# Patient Record
Sex: Male | Born: 1955 | Race: White | Hispanic: No | Marital: Single | Smoking: Never smoker
Health system: Southern US, Community
[De-identification: ages and names within clinical notes are randomized; demographics above are authoritative.]

## PROBLEM LIST (undated history)

## (undated) ENCOUNTER — Ambulatory Visit (INDEPENDENT_AMBULATORY_CARE_PROVIDER_SITE_OTHER): Admission: RE | Payer: Self-pay

## (undated) DIAGNOSIS — Z87898 Personal history of other specified conditions: Secondary | ICD-10-CM

## (undated) DIAGNOSIS — M8700 Idiopathic aseptic necrosis of unspecified bone: Secondary | ICD-10-CM

## (undated) DIAGNOSIS — H539 Unspecified visual disturbance: Secondary | ICD-10-CM

## (undated) DIAGNOSIS — E785 Hyperlipidemia, unspecified: Secondary | ICD-10-CM

## (undated) DIAGNOSIS — M25559 Pain in unspecified hip: Secondary | ICD-10-CM

## (undated) DIAGNOSIS — G8929 Other chronic pain: Secondary | ICD-10-CM

## (undated) DIAGNOSIS — I10 Essential (primary) hypertension: Secondary | ICD-10-CM

## (undated) DIAGNOSIS — R262 Difficulty in walking, not elsewhere classified: Secondary | ICD-10-CM

## (undated) DIAGNOSIS — M1990 Unspecified osteoarthritis, unspecified site: Secondary | ICD-10-CM

## (undated) DIAGNOSIS — K219 Gastro-esophageal reflux disease without esophagitis: Secondary | ICD-10-CM

## (undated) DIAGNOSIS — D869 Sarcoidosis, unspecified: Secondary | ICD-10-CM

## (undated) HISTORY — DX: Essential (primary) hypertension: I10

## (undated) HISTORY — DX: Pain in unspecified hip: M25.559

## (undated) HISTORY — DX: Personal history of other specified conditions: Z87.898

## (undated) HISTORY — DX: Sarcoidosis, unspecified: D86.9

## (undated) HISTORY — DX: Hyperlipidemia, unspecified: E78.5

## (undated) HISTORY — PX: ROTATOR CUFF REPAIR: SHX139

## (undated) HISTORY — DX: Idiopathic aseptic necrosis of unspecified bone: M87.00

## (undated) HISTORY — PX: JOINT REPLACEMENT: SHX530

## (undated) HISTORY — DX: Gastro-esophageal reflux disease without esophagitis: K21.9

## (undated) HISTORY — DX: Other chronic pain: G89.29

---

## 1999-03-08 ENCOUNTER — Ambulatory Visit
Admit: 1999-03-08 | Disposition: A | Discharge status: Home / Self Care | Payer: Self-pay | Source: Ambulatory Visit | Admitting: Orthopaedic Surgery

## 1999-03-28 ENCOUNTER — Ambulatory Visit
Admit: 1999-03-28 | Disposition: A | Discharge status: Home / Self Care | Payer: Self-pay | Source: Ambulatory Visit | Admitting: Neurology

## 1999-04-05 ENCOUNTER — Ambulatory Visit
Admit: 1999-04-05 | Disposition: A | Discharge status: Home / Self Care | Payer: Self-pay | Source: Ambulatory Visit | Admitting: Neurology

## 1999-04-08 ENCOUNTER — Ambulatory Visit: Admission: EM | Admit: 1999-04-08 | Payer: Self-pay | Source: Emergency Department

## 1999-04-11 ENCOUNTER — Ambulatory Visit
Admit: 1999-04-11 | Disposition: A | Discharge status: Home / Self Care | Payer: Self-pay | Source: Ambulatory Visit | Admitting: Infectious Disease

## 1999-05-07 ENCOUNTER — Ambulatory Visit: Admit: 1999-05-07 | Disposition: A | Discharge status: Home / Self Care | Payer: Self-pay | Source: Ambulatory Visit

## 1999-11-27 ENCOUNTER — Ambulatory Visit: Admit: 1999-11-27 | Disposition: A | Discharge status: Home / Self Care | Payer: Self-pay | Source: Ambulatory Visit

## 1999-12-07 ENCOUNTER — Ambulatory Visit: Admit: 1999-12-07 | Disposition: A | Discharge status: Home / Self Care | Payer: Self-pay | Source: Ambulatory Visit

## 1999-12-12 ENCOUNTER — Ambulatory Visit: Admit: 1999-12-12 | Disposition: A | Discharge status: Home / Self Care | Payer: Self-pay | Source: Ambulatory Visit

## 1999-12-21 ENCOUNTER — Emergency Department: Admit: 1999-12-21 | Payer: Self-pay | Source: Emergency Department | Admitting: Emergency Medical Services

## 2000-01-14 ENCOUNTER — Ambulatory Visit: Admit: 2000-01-14 | Disposition: A | Discharge status: Home / Self Care | Payer: Self-pay | Source: Ambulatory Visit

## 2000-02-12 ENCOUNTER — Ambulatory Visit
Admit: 2000-02-12 | Disposition: A | Discharge status: Home / Self Care | Payer: Self-pay | Source: Ambulatory Visit | Admitting: Critical Care Medicine

## 2000-07-12 ENCOUNTER — Emergency Department: Admit: 2000-07-12 | Payer: Self-pay | Source: Emergency Department

## 2000-07-15 ENCOUNTER — Ambulatory Visit: Admission: RE | Admit: 2000-07-15 | Payer: Self-pay | Source: Ambulatory Visit | Admitting: Otolaryngology

## 2002-04-08 DIAGNOSIS — G971 Other reaction to spinal and lumbar puncture: Secondary | ICD-10-CM

## 2002-04-08 HISTORY — DX: Other reaction to spinal and lumbar puncture: G97.1

## 2002-05-06 ENCOUNTER — Ambulatory Visit: Admit: 2002-05-06 | Disposition: A | Discharge status: Home / Self Care | Payer: Self-pay | Source: Ambulatory Visit

## 2004-06-01 ENCOUNTER — Ambulatory Visit: Admit: 2004-06-01 | Disposition: A | Discharge status: Home / Self Care | Payer: Self-pay | Source: Ambulatory Visit

## 2004-08-06 ENCOUNTER — Inpatient Hospital Stay
Admission: RE | Admit: 2004-08-06 | Disposition: A | Discharge status: Home / Self Care | Payer: Self-pay | Source: Ambulatory Visit

## 2004-11-06 HISTORY — PX: TOTAL HIP ARTHROPLASTY: SHX124

## 2011-01-01 ENCOUNTER — Other Ambulatory Visit (INDEPENDENT_AMBULATORY_CARE_PROVIDER_SITE_OTHER): Payer: Self-pay | Admitting: Family Medicine

## 2011-01-10 ENCOUNTER — Ambulatory Visit
Admit: 2011-01-10 | Discharge: 2011-01-10 | Disposition: A | Discharge status: Home / Self Care | Payer: Self-pay | Source: Ambulatory Visit

## 2011-01-10 LAB — CBC
Hematocrit: 42.2 % (ref 42.0–52.0)
Hgb: 14.5 g/dL (ref 13.0–17.0)
MCH: 31.2 pg (ref 28.0–32.0)
MCHC: 34.4 g/dL (ref 32.0–36.0)
MCV: 90.8 fL (ref 80.0–100.0)
MPV: 10.5 fL (ref 9.4–12.3)
Nucleated RBC: 0 /100 WBC
Platelets: 180 10*3/uL (ref 140–400)
RBC: 4.65 10*6/uL — ABNORMAL LOW (ref 4.70–6.00)
RDW: 13 % (ref 12–15)
WBC: 4.9 10*3/uL (ref 3.50–10.80)

## 2011-01-10 LAB — GFR: EGFR: 57.7

## 2011-01-10 LAB — BASIC METABOLIC PANEL
Anion Gap: 12 (ref 5.0–15.0)
BUN: 16 mg/dL (ref 7–21)
CO2: 22 mEq/L (ref 22–31)
Calcium: 9 mg/dL (ref 8.6–10.2)
Chloride: 105 mEq/L (ref 98–107)
Creatinine: 1.3 mg/dL (ref 0.5–1.4)
Glucose: 132 mg/dL — ABNORMAL HIGH (ref 70–100)
Potassium: 3.7 mEq/L (ref 3.6–5.0)
Sodium: 139 mEq/L (ref 136–143)

## 2011-01-10 LAB — PT/INR
PT INR: 0.9 (ref 0.9–1.1)
PT: 12.6 s (ref 12.6–15.0)

## 2011-01-10 LAB — APTT: PTT: 26 s (ref 23–37)

## 2011-01-11 LAB — MRSA CULTURE

## 2011-01-18 HISTORY — PX: REPLACEMENT TOTAL KNEE: SUR1224

## 2011-01-24 ENCOUNTER — Ambulatory Visit: Payer: Self-pay

## 2011-01-24 ENCOUNTER — Inpatient Hospital Stay
Admission: RE | Admit: 2011-01-24 | Disposition: A | Discharge status: Home / Self Care | Payer: Self-pay | Source: Ambulatory Visit | Attending: Orthopaedic Surgery | Admitting: Orthopaedic Surgery

## 2011-01-24 LAB — TYPE AND SCREEN
AB Screen Gel: NEGATIVE
ABO Rh: A POS

## 2011-01-25 LAB — BASIC METABOLIC PANEL
Anion Gap: 9 (ref 5.0–15.0)
BUN: 13 mg/dL (ref 7–21)
CO2: 25 mEq/L (ref 22–31)
Calcium: 8.3 mg/dL — ABNORMAL LOW (ref 8.6–10.2)
Chloride: 103 mEq/L (ref 98–107)
Creatinine: 1 mg/dL (ref 0.5–1.4)
Glucose: 125 mg/dL — ABNORMAL HIGH (ref 70–100)
Potassium: 4.1 mEq/L (ref 3.6–5.0)
Sodium: 137 mEq/L (ref 136–143)

## 2011-01-25 LAB — CBC AND DIFFERENTIAL
Basophils Absolute Automated: 0.01 10*3/uL (ref 0.00–0.20)
Basophils Automated: 0 % (ref 0–2)
Eosinophils Absolute Automated: 0.03 10*3/uL (ref 0.00–0.70)
Eosinophils Automated: 0 % (ref 0–5)
Hematocrit: 35.9 % — ABNORMAL LOW (ref 42.0–52.0)
Hgb: 12.2 g/dL — ABNORMAL LOW (ref 13.0–17.0)
Immature Granulocytes Absolute: 0.03 10*3/uL
Immature Granulocytes: 0 % (ref 0–1)
Lymphocytes Absolute Automated: 1.49 10*3/uL (ref 0.50–4.40)
Lymphocytes Automated: 14 % — ABNORMAL LOW (ref 15–41)
MCH: 30.7 pg (ref 28.0–32.0)
MCHC: 34 g/dL (ref 32.0–36.0)
MCV: 90.2 fL (ref 80.0–100.0)
MPV: 10.8 fL (ref 9.4–12.3)
Monocytes Absolute Automated: 0.99 10*3/uL (ref 0.00–1.20)
Monocytes: 9 % (ref 0–11)
Neutrophils Absolute: 8.06 10*3/uL (ref 1.80–8.10)
Neutrophils: 76 % — ABNORMAL HIGH (ref 52–75)
Nucleated RBC: 0 /100 WBC
Platelets: 149 10*3/uL (ref 140–400)
RBC: 3.98 10*6/uL — ABNORMAL LOW (ref 4.70–6.00)
RDW: 12 % (ref 12–15)
WBC: 10.58 10*3/uL (ref 3.50–10.80)

## 2011-01-25 LAB — GFR: EGFR: >60

## 2011-01-26 LAB — CBC AND DIFFERENTIAL
Basophils Absolute Automated: 0.02 10*3/uL (ref 0.00–0.20)
Basophils Automated: 0 % (ref 0–2)
Eosinophils Absolute Automated: 0.12 10*3/uL (ref 0.00–0.70)
Eosinophils Automated: 2 % (ref 0–5)
Hematocrit: 34.9 % — ABNORMAL LOW (ref 42.0–52.0)
Hgb: 11.8 g/dL — ABNORMAL LOW (ref 13.0–17.0)
Immature Granulocytes Absolute: 0.02 10*3/uL
Immature Granulocytes: 0 % (ref 0–1)
Lymphocytes Absolute Automated: 1.4 10*3/uL (ref 0.50–4.40)
Lymphocytes Automated: 19 % (ref 15–41)
MCH: 30.7 pg (ref 28.0–32.0)
MCHC: 33.8 g/dL (ref 32.0–36.0)
MCV: 90.9 fL (ref 80.0–100.0)
MPV: 10.5 fL (ref 9.4–12.3)
Monocytes Absolute Automated: 0.94 10*3/uL (ref 0.00–1.20)
Monocytes: 13 % — ABNORMAL HIGH (ref 0–11)
Neutrophils Absolute: 4.93 10*3/uL (ref 1.80–8.10)
Neutrophils: 66 % (ref 52–75)
Nucleated RBC: 0 /100 WBC
Platelets: 125 10*3/uL — ABNORMAL LOW (ref 140–400)
RBC: 3.84 10*6/uL — ABNORMAL LOW (ref 4.70–6.00)
RDW: 13 % (ref 12–15)
WBC: 7.41 10*3/uL (ref 3.50–10.80)

## 2011-01-26 LAB — BASIC METABOLIC PANEL
Anion Gap: 8 (ref 5.0–15.0)
BUN: 10 mg/dL (ref 7–21)
CO2: 26 mEq/L (ref 22–31)
Calcium: 8.4 mg/dL — ABNORMAL LOW (ref 8.6–10.2)
Chloride: 101 mEq/L (ref 98–107)
Creatinine: 1.1 mg/dL (ref 0.5–1.4)
Glucose: 124 mg/dL — ABNORMAL HIGH (ref 70–100)
Potassium: 3.9 mEq/L (ref 3.6–5.0)
Sodium: 135 mEq/L — ABNORMAL LOW (ref 136–143)

## 2011-01-26 LAB — GFR: EGFR: >60

## 2011-01-28 LAB — LAB USE ONLY - HISTORICAL SURGICAL PATHOLOGY

## 2011-02-06 NOTE — Op Note (Signed)
Jerry Hickman, Jerry Hickman      MRN:          [id number]      Account:      [id number]      Document ID:  [id number]      Procedure Date: 01/24/2011            Admit Date: 01/24/2011            Patient Location: [id number]      Patient Type: I            SURGEON: Ashok Croon MD      ASSISTANT:  Joanne Gavel PA                  PREOPERATIVE DIAGNOSIS:      End-stage degenerative joint disease, right knee.            POSTOPERATIVE DIAGNOSIS:      End-stage degenerative joint disease, right knee.            TITLE OF PROCEDURE:      Right total knee arthroplasty.            ANESTHESIA:      General with femoral and sciatic nerve block.            INDICATIONS FOR PROCEDURE:      The patient is a healthy active 55 year old gentleman with end-stage      arthritis involving the patellofemoral and medial compartment of his knee.      He has failed all manner of conservative management and also had a prior      arthroscopy.  We discussed surgical treatment, risks, benefits, and      potential complications were discussed for total knee arthroplasty.  The      patient agreed and wished to proceed with surgery as described.            DESCRIPTION OF PROCEDURE:      The patient was taken to the operating room, placed in a supine position on      the OR table.  General anesthesia was initiated, 1 g Ancef given      preoperatively.  The right lower extremity was prepped and draped in a      standard sterile fashion and appropriately identified.  We turned our      attention to the right knee.  Esmarch was used for exsanguination,      tourniquet inflated to 350.  We made a standard midline approach, incised      through the skin and subcutaneous layer using Bovie cautery for hemostasis.       A medial parapatellar arthrotomy was performed.  Eburnated bone was noted      in the medial patellofemoral compartments.  We excised remnants of the ACL,      PCL, and menisci.  We then drilled for the intramedullary on the femur, we       resected 11 mm at a 5 degree valgus cut from the distal femur, made sure      this cut was smooth.  We turned our attention to the tibia, resected the      appropriate tibial bone 2 mm from the more deficient medial side with a      perpendicular cut, approximately 3 degrees posterior slope.  We checked our      extension gap at 11 and this fit well.  We then turned our attention back  Page 1 of 2      Jerry Hickman, Jerry Hickman      MRN:          [id number]      Account:      [id number]      Document ID:  [id number]      Procedure Date: 01/24/2011            to the femur, sized it at 5, made the anterior, posterior, chamfer, and box      cuts, and trialed the femoral component, which fit well.  We turned our      attention to the tibia, sized this at 5, and placed a 10 spacer in marked      rotation of the tibial component.  We then sized the patella at size 38,      resected appropriate bone to leave 15 mm, and then drilled for the 3 lug      holes.  All trial components were placed, again tibial component rotation      was marked.  We completed tibial preparation by drilling and tamping for      the fins.            Following this, we irrigated thoroughly using Pulsavac irrigation and dried      thoroughly.  Cement was mixed and components were cemented into position.      Excess cement removed.  We then placed a 10 mm spacer block and again noted      excellent range of motion.            We irrigated thoroughly again, placed the Constavac drain underneath the      fascia and brought this out through the skin.  We closed the arthrotomy      using #1 Vicryl, 2-0 Vicryl in subcutaneous layer, and Monocryl for the      skin.  Sterile compression dressing was applied.  The patient was placed      into an immobilizer without difficulty and transferred from the bed to      recovery in stable condition.  He tolerated the procedure well.  No      complications encountered.            [name], PA-C, was instrumental in the performance of this procedure      in regard to limb position, retraction, maintenance of the positioning      during surgery, and wound closure.                        Electronic Signing Provider            D:  01/24/2011 09:37 AM by Dr. Elmon Else. Veneta Penton, MD [phone])      T:  01/24/2011 12:27 PM by [id number]                  cc:                                   Page 2 of 2      Authenticated by Brain Hilts, MD [phone]) On 01/31/2011 12:22:02 PM

## 2011-02-08 LAB — ECG 12-LEAD
Atrial Rate: 65 {beats}/min
P Axis: 24 degrees
P-R Interval: 186 ms
Q-T Interval: 390 ms
QRS Duration: 98 ms
QTC Calculation (Bezet): 405 ms
R Axis: 0 degrees
T Axis: 20 degrees
Ventricular Rate: 65 {beats}/min

## 2011-03-22 ENCOUNTER — Other Ambulatory Visit (INDEPENDENT_AMBULATORY_CARE_PROVIDER_SITE_OTHER): Payer: Self-pay

## 2011-03-22 DIAGNOSIS — N529 Male erectile dysfunction, unspecified: Secondary | ICD-10-CM

## 2011-03-22 MED ORDER — SILDENAFIL CITRATE 50 MG PO TABS
50.0000 mg | ORAL_TABLET | ORAL | Status: AC | PRN
Start: 2011-03-22 — End: 2012-03-21

## 2011-04-16 ENCOUNTER — Other Ambulatory Visit (INDEPENDENT_AMBULATORY_CARE_PROVIDER_SITE_OTHER): Payer: Self-pay | Admitting: Family Medicine

## 2011-04-16 ENCOUNTER — Telehealth (INDEPENDENT_AMBULATORY_CARE_PROVIDER_SITE_OTHER): Payer: Self-pay | Admitting: Family Medicine

## 2011-04-16 DIAGNOSIS — E782 Mixed hyperlipidemia: Secondary | ICD-10-CM

## 2011-04-16 MED ORDER — ROSUVASTATIN CALCIUM 10 MG PO TABS
10.0000 mg | ORAL_TABLET | Freq: Every day | ORAL | Status: DC
Start: 2011-04-16 — End: 2012-01-24

## 2011-04-16 NOTE — Telephone Encounter (Signed)
Advised pt's wife that Rx for 6 months [facility]. Pt's wife advised to make appointment see MD

## 2011-04-17 NOTE — Telephone Encounter (Signed)
Leave Message

## 2011-09-10 ENCOUNTER — Telehealth (INDEPENDENT_AMBULATORY_CARE_PROVIDER_SITE_OTHER): Payer: Self-pay | Admitting: Family Medicine

## 2011-09-10 DIAGNOSIS — E782 Mixed hyperlipidemia: Secondary | ICD-10-CM

## 2011-09-10 DIAGNOSIS — I10 Essential (primary) hypertension: Secondary | ICD-10-CM

## 2011-09-10 NOTE — Telephone Encounter (Signed)
Discussed with patient concerning need for alternative trial of other statin such as Lovastatin, Pravastatin, Simvastatin, Atorvastatin, Fluvastatin. Pt will come in for blood work as well as OV, will make decision at that time. Continue Crestor for now.

## 2011-09-16 ENCOUNTER — Encounter (INDEPENDENT_AMBULATORY_CARE_PROVIDER_SITE_OTHER): Payer: Self-pay | Admitting: Family Medicine

## 2011-09-16 NOTE — Progress Notes (Signed)
Tc with [facility], Crestor not covered any more. Need to switch to alternative. Will discuss with patient after next blood work.

## 2011-10-01 ENCOUNTER — Telehealth (INDEPENDENT_AMBULATORY_CARE_PROVIDER_SITE_OTHER): Payer: Self-pay

## 2011-10-01 NOTE — Telephone Encounter (Addendum)
[facility] called and wanted a prioauth on the Crestor. I started and completed the prior auth, which was approved, confirmation # [id number]. I notified the pt of the approval and his rx would be mailed to him.

## 2011-10-01 NOTE — Progress Notes (Signed)
Labs drawn by [facility].

## 2011-10-02 ENCOUNTER — Ambulatory Visit (INDEPENDENT_AMBULATORY_CARE_PROVIDER_SITE_OTHER): Payer: BLUE CROSS/BLUE SHIELD

## 2011-10-02 DIAGNOSIS — I10 Essential (primary) hypertension: Secondary | ICD-10-CM

## 2011-10-02 DIAGNOSIS — E782 Mixed hyperlipidemia: Secondary | ICD-10-CM

## 2011-10-03 LAB — COMPREHENSIVE METABOLIC PANEL
ALT: 30 U/L (ref 9–46)
AST (SGOT): 22 U/L (ref 10–35)
Albumin/Globulin Ratio: 2.1 (ref 1.0–2.5)
Albumin: 4.7 G/DL (ref 3.6–5.1)
Alkaline Phosphatase: 68 U/L (ref 40–115)
BUN: 18 MG/DL (ref 7–25)
Bilirubin, Total: 0.4 MG/DL (ref 0.2–1.2)
CO2: 22 mmol/L (ref 19–30)
Calcium: 9.6 MG/DL (ref 8.6–10.3)
Chloride: 106 mmol/L (ref 98–110)
Creatinine: 1.31 mg/dL (ref 0.70–1.33)
EGFR African American: 72 mL/min/{1.73_m2} (ref >=60)
EGFR: 62 mL/min/{1.73_m2} (ref >=60)
Globulin: 2.2 G/DL (ref 1.9–3.7)
Glucose: 91 MG/DL (ref 65–99)
Potassium: 4.6 mmol/L (ref 3.5–5.3)
Protein, Total: 6.9 G/DL (ref 6.1–8.1)
Sodium: 141 mmol/L (ref 135–146)

## 2011-10-03 LAB — LIPID PANEL
Cholesterol / HDL Ratio: 2.7 (ref 0.0–5.0)
Cholesterol: 181 MG/DL (ref 125–200)
HDL: 66 MG/DL (ref >=40)
LDL Calculated: 89 MG/DL (ref <130)
Non HDL Cholesterol (LDL and VLDL): 115 mg/dL
Triglycerides: 128 MG/DL (ref <150)

## 2011-10-03 LAB — MICROALBUMIN, RANDOM URINE
Creatinine, UR: 396 mg/dL — ABNORMAL HIGH (ref 20–370)
Microalbumin MCG/MG Creatinine: 2.5 mcg/mg crea
Microalbumin, UR MG/DL: 1 mg/dL

## 2011-10-03 LAB — CREATININE, URINE, RANDOM: Creatinine, UR: 396 mg/dL — ABNORMAL HIGH (ref 20–370)

## 2011-10-04 ENCOUNTER — Encounter (INDEPENDENT_AMBULATORY_CARE_PROVIDER_SITE_OTHER): Payer: Self-pay | Admitting: Family Medicine

## 2011-10-04 ENCOUNTER — Ambulatory Visit (INDEPENDENT_AMBULATORY_CARE_PROVIDER_SITE_OTHER): Payer: BLUE CROSS/BLUE SHIELD | Admitting: Family Medicine

## 2011-10-04 VITALS — BP 126/87 | HR 80 | Temp 98.9°F | Resp 18 | Ht 69.29 in | Wt 220.4 lb

## 2011-10-04 DIAGNOSIS — I10 Essential (primary) hypertension: Secondary | ICD-10-CM | POA: Insufficient documentation

## 2011-10-04 DIAGNOSIS — E291 Testicular hypofunction: Secondary | ICD-10-CM

## 2011-10-04 DIAGNOSIS — Z862 Personal history of diseases of the blood and blood-forming organs and certain disorders involving the immune mechanism: Secondary | ICD-10-CM | POA: Insufficient documentation

## 2011-10-04 DIAGNOSIS — Z125 Encounter for screening for malignant neoplasm of prostate: Secondary | ICD-10-CM

## 2011-10-04 DIAGNOSIS — D649 Anemia, unspecified: Secondary | ICD-10-CM

## 2011-10-04 DIAGNOSIS — N528 Other male erectile dysfunction: Secondary | ICD-10-CM | POA: Insufficient documentation

## 2011-10-04 DIAGNOSIS — E782 Mixed hyperlipidemia: Secondary | ICD-10-CM | POA: Insufficient documentation

## 2011-10-04 DIAGNOSIS — N529 Male erectile dysfunction, unspecified: Secondary | ICD-10-CM

## 2011-10-04 NOTE — Addendum Note (Signed)
Addended bySelena Batten, Sheray Grist TAE on: 10/04/2011 11:58 AM     Modules accepted: Orders

## 2011-10-04 NOTE — Progress Notes (Signed)
Subjective:       Patient ID: Jerry Hickman is a 56 y.o. male.    HPI  This is 56 y/o male with hx of HLP, HTN, ED her for follow up. Pt states he's been taking Norvasc 10mg  daily. No dizziness/HA/visual changes.   HLP: taking Crestor 10mg  daily. No myalgia/ GI sx  Pt continuing to have ED despite using Viagra 50mg  prn.   Last CBC showed mild anemia, pt currently does not have any dizziness/HA. Likely due to Rt knee replacement.   Lab Results   Component Value Date    WBC 7.41 01/26/2011    HGB 11.8* 01/26/2011    HCT 34.9* 01/26/2011    MCV 90.9 01/26/2011    PLT 125* 01/26/2011       Chemistry        Component Value Date/Time    NA 141 10/02/2011 0859    K 4.6 10/02/2011 0859    CL 106 10/02/2011 0859    CL 101 01/26/2011 0554    CO2 22 10/02/2011 0859    BUN 18 10/02/2011 0859    GLU 91 10/02/2011 0859        Component Value Date/Time    ALKPHOS 68 10/02/2011 0859    AST 22 10/02/2011 0859    ALT 30 10/02/2011 0859        Lab Results   Component Value Date    CHOL 181 10/02/2011     Lab Results   Component Value Date    HDL 66 10/02/2011     Lab Results   Component Value Date    LDL 89 10/02/2011     Lab Results   Component Value Date    TRIG 128 10/02/2011       The following portions of the patient's history were reviewed and updated as appropriate:   No Known Allergies    Past Medical History   Diagnosis Date   . Hyperlipidemia    . Hypertensive disorder    . GERD (gastroesophageal reflux disease)    . Depression screening negative 10-04-11     Neg       History     Social History   . Marital Status: Married     Spouse Name: N/A     Number of Children: N/A   . Years of Education: N/A     Occupational History   . Not on file.     Social History Main Topics   . Smoking status: Never Smoker    . Smokeless tobacco: Never Used   . Alcohol Use: 2.0 oz/week     4 drink(s) per week   . Drug Use: No   . Sexually Active: Not on file     Other Topics Concern   . Not on file     Social History Narrative   . No narrative on file        Past Surgical History   Procedure Date   . Replacement total knee 01-18-11     Left   . Total hip arthroplasty 11-2004     Right       There is no problem list on file for this patient.      Current outpatient prescriptions:amLODIPine (NORVASC) 10 MG tablet, TAKE 1 TABLET DAILY, Disp: 90 tablet, Rfl: 2;  pantoprazole (PROTONIX) 20 MG tablet, Take 20 mg by mouth daily., Disp: , Rfl: ;  rosuvastatin (CRESTOR) 10 MG tablet, Take 1 tablet (10 mg total) by mouth daily.,  Disp: 90 tablet, Rfl: 1;  sildenafil (VIAGRA) 50 MG tablet, Take 1 tablet (50 mg total) by mouth as needed for Erectile Dysfunction., Disp: 20 tablet, Rfl: 3      Review of Systems  No acute fatigue, no significiant weight changes. Pt denies HA/visual changes/dizziness. No chest pain/SOB/palpitation symptoms. Denies any GI changes. No acute neurologic changes. All other systems were reviewed and was negative.         Objective:    Physical Exam  BP 126/87  Pulse 80  Temp(Src) 98.9 F (37.2 C) (Oral)  Resp 18  Ht 1.76 m (5' 9.29")  Wt 99.973 kg (220 lb 6.4 oz)  BMI 32.27 kg/m2  NAD. CV: RRR, no murmur or gallops. Resp: CTAB, no rhonchi or crackles. Abdom: Soft, NT/ND. Positive BS. No masses. Ext: no edema. Skin: no abnormal rash. Neuro: normal gait, normal motor function exam, sensation intact.         Assessment:       Encounter Diagnoses   Name Primary?   . Hypogonadism male Yes   . Anemia    . Screening for prostate cancer    . History of sarcoidosis    . Essential hypertension, benign    . Mixed hyperlipidemia    . ED (erectile dysfunction)            Plan:       1. Will recheck CBC today. Reviewed normal lipid panel as well as CMP.   2. Will check T. Testosterone as well.   3. Continue BP and HLP med.   4. Return in 2-3 months for CPE.     I have spent 25 minutes during this visit, more than half of visit spent on patient education and instructions.     Orders Placed This Encounter   Procedures   . PSA   . CBC without differential   .  Testosterone

## 2011-10-04 NOTE — Progress Notes (Signed)
Have you self referred yourself since we last saw you?Yes    -Refer to care team-   Or  List Specialists:    __________________________________________________________          1.  Over the last two weeks, have you been bothered by feeling  down, depressed, or hopeless?  No    2.  Over the last two weeks, have you been bothered by little  interest or pleasure in doing things? No        Scoring:    Not at all: 0  Several days: 1  More than half the days:2  Nearly every day: 3      ONLY IF A 3 IS SCORED ON EITHER OF THE 2 QUESTIONS, THEN GIVE OUT PHQ9

## 2011-10-09 ENCOUNTER — Other Ambulatory Visit (INDEPENDENT_AMBULATORY_CARE_PROVIDER_SITE_OTHER): Payer: Self-pay | Admitting: Family Medicine

## 2011-10-09 ENCOUNTER — Ambulatory Visit (INDEPENDENT_AMBULATORY_CARE_PROVIDER_SITE_OTHER): Payer: BLUE CROSS/BLUE SHIELD

## 2011-10-09 DIAGNOSIS — E291 Testicular hypofunction: Secondary | ICD-10-CM

## 2011-10-11 ENCOUNTER — Other Ambulatory Visit (INDEPENDENT_AMBULATORY_CARE_PROVIDER_SITE_OTHER): Payer: Self-pay | Admitting: Family Medicine

## 2011-10-11 DIAGNOSIS — E291 Testicular hypofunction: Secondary | ICD-10-CM

## 2011-10-11 NOTE — Progress Notes (Signed)
Lab drawn by [facility]

## 2011-10-14 LAB — CBC
Hematocrit: 42.3 % (ref 38.5–50.0)
Hemoglobin: 14.4 g/dL (ref 13.2–17.1)
MCH: 31.1 pg (ref 27–33)
MCHC: 34 g/dL (ref 32–36)
MCV: 92 fL (ref 80–100)
MPV: 8.8 fL (ref 7.5–11.5)
Platelets: 159 10*3/uL (ref 140–400)
RBC: 4.61 10*6/uL (ref 4.20–5.80)
RDW: 13.8 % (ref 11.0–15.0)
WBC: 4.5 10*3/uL (ref 3.8–10.8)

## 2011-10-14 LAB — TESTOSTERONE, FREE, TOTAL
Testosterone Free: 68.7 pg/mL (ref 35.0–155.0)
Testosterone Total MS: 291 ng/dL (ref 250–1100)

## 2011-10-14 LAB — TESTOSTERONE: Testosterone Total: 232 ng/dL — ABNORMAL LOW (ref 241–827)

## 2011-10-14 LAB — PSA: Prostate Specific Antigen, Total: 0.3 ng/mL (ref 0.0–4.0)

## 2012-01-24 ENCOUNTER — Other Ambulatory Visit (INDEPENDENT_AMBULATORY_CARE_PROVIDER_SITE_OTHER): Payer: Self-pay

## 2012-01-24 DIAGNOSIS — E782 Mixed hyperlipidemia: Secondary | ICD-10-CM

## 2012-01-24 MED ORDER — ROSUVASTATIN CALCIUM 10 MG PO TABS
10.0000 mg | ORAL_TABLET | Freq: Every day | ORAL | Status: DC
Start: 2012-01-24 — End: 2012-07-31

## 2012-01-24 MED ORDER — PANTOPRAZOLE SODIUM 20 MG PO TBEC
20.00 mg | DELAYED_RELEASE_TABLET | Freq: Every day | ORAL | Status: DC
Start: 2012-01-24 — End: 2012-07-14

## 2012-01-24 MED ORDER — AMLODIPINE BESYLATE 10 MG PO TABS
10.0000 mg | ORAL_TABLET | Freq: Every day | ORAL | Status: DC
Start: 2012-01-24 — End: 2012-07-31

## 2012-01-24 NOTE — Telephone Encounter (Signed)
Message copied by Selinda Orion on Fri Jan 24, 2012 11:19 AM  ------       Message from: Birdena Jubilee       Created: Fri Jan 24, 2012 11:07 AM       Regarding: rx dr kims pt       Contact: [phone]         1. rosuvastatin (CRESTOR) 10 MG tablet take 1 tab daily       2. amLODIPine (NORVASC) 10 MG tablet take 1 tab daily       3. pantoprazole (PROTONIX) 20 MG tablet take 1 tab daily              [id number] - [place], [place] - [address] AT [facility] [phone] (Phone)       [phone] (Fax)

## 2012-03-30 NOTE — Op Note (Unsigned)
DATE OF SURGERY:                    07/15/2000            SURGEON:                            Thurman Coyer, MD            ASSISTANT(S):                  PREOPERATIVE DIAGNOSIS:             Parotid gland enlargement.            POSTOPERATIVE DIAGNOSIS:            Parotid gland enlargement.            PROCEDURE PERFORMED:                 Right  incisional  biopsy  of  parotid      gland.            ANESTHESIA:                         Local.            COMPLICATIONS:                      None.            DESCRIPTION OF PROCEDURE:           After consent was obtained, the patient      was brought into the operating room and placed supine on the operating room      table where he was prepped and draped in the usual fashion.  Xylocaine with      epinephrine was injected in the planned  incision site and a small incision      was made in the crease in front of his  ear  and  dissection  carried  deep      until the parotid fascia was encountered.   This  was  incised  and a small      portion of parotid gland was removed.   He  had  some small twitches on his      face with deeper dissection which both  he could feel and an observer could      see.  Therefore, it was felt that it was not prudent to obtain more tissue.      The tissue was sent in formalin for pathologic  evaluation.   The  incision      was then closed in two layers.  The  patient  was  then  transported to the      recovery room stable, having tolerated the procedure well.                                                        _____________________________________  _____                                            Thurman Coyer, MD      BAM/kg      D: 07/22/2000 1:55 P      T: 07/23/2000 11:02 A      J: [id number]      N: [id number]      CC: Thurman Coyer, MD

## 2012-03-30 NOTE — Op Note (Unsigned)
DATE OF SURGERY:                    02/12/2000            SURGEON:                            Dyann Ruddle, MD            ASSISTANT(S):                       Melina Copa, MD                  PREOPERATIVE DIAGNOSIS:  This is a 56 year old  white  male, remote smoker,      with recent history of transverse myelitis,  possibly  due  to sarcoidosis,      who presents for biopsy.            POSTOPERATIVE DIAGNOSIS:  This is a 56 year old  white male, remote smoker,      with recent history of transverse myelitis,  possibly  due  to sarcoidosis,      who presents for biopsy; rule out sarcoidosis.            TITLE OF SURGERY:  Right lower lobe transbronchial biopsy.            ATTENDING PHYSICIAN:  Mariah Milling, MD.            HISTORY:  The patient is a delightful 56 year old white male with a history      of recent optic disc swelling and transverse myelitis representing possible      neurosarcoidosis.   The  patient  presents   for   tissue  confirmation  of      sarcoidosis and has agreed to transbronchial biopsy after a full discussion      of the risks and benefits.            DESCRIPTION OF PROCEDURE:  The patient  was  brought  to  the  bronchoscopy      suite and given sedation consisting  of  Versed  10  mg  intravenously  and      Demerol  100  mg intravenously.  Local  anesthesia  was  achieved  with  2%      Xylocaine jelly and 1% Xylocaine solution.   The  flexible fiberoptic scope      was passed via the left naris into the  tracheobronchial  tree.   The vocal      cords moved normally, and there were no  tracheal lesions.  The main carina      was  sharp.   There  were no endobronchial  lesions.   Subsequently,  under      fluoroscopic guidance the bronchoscope was wedged into the right lower lobe      bronchus, and multiple biopsies were  taken  from  the  right  lower  lobe.      There  was  a  mild to moderate amount  of  postbiopsy  bleeding  that  was      controlled with saline lavage and  epinephrine.            Overall, the patient tolerated the procedure  well.   He was talking at the      conclusion  of the  procedure.  Chest  x-ray  is  pending  at  the  time  of      dictation.                                                        _____________________________________                                            _____                                            Dyann Ruddle, MD      PCC/mdisjt      D: 11/06/200111:05 A      T: 02/13/2000 11:46 P      J: [id number]      N: [id number]      CC: Moody Bruins, MD         Yetta Flock, MD         Depak Romilda Garret, MD

## 2012-04-08 HISTORY — PX: COLONOSCOPY, DIAGNOSTIC (SCREENING): SHX174

## 2012-04-10 NOTE — Op Note (Signed)
ROOM NUMBER:                           [id number]            SURGEON:                                Rosezella Rumpf, MD            DATE:                                   08/06/2004                  FIRST ASSISTANT:  Edwyna Ready, PA            SECOND ASSISTANT:  Jacqulynn Cadet, PA (student)            PREOPERATIVE DIAGNOSIS:  Avascular necrosis, right hip.            POSTOPERATIVE DIAGNOSIS:  Avascular necrosis, right hip.            OPERATION:  Right uncemented metal-on-metal total hip arthroplasty.            ANESTHESIA:  General orotracheal.            ESTIMATED BLOOD LOSS:  About 250 mL.            FLUID REPLACEMENT:  3 liters.            URINE OUTPUT:  85 mL.            COMPLICATIONS:  Without.            INDICATIONS:  A 57 year old man with avascular necrosis.  He has been on      chronic steroids and methotrexate for sarcoidosis.            PROCEDURE:  The patient was brought to the operating room, general      anesthesia administered.  A Foley catheter was placed.  He was placed on      his left side.  The right hip region is prepped with Betadine and then      Duraprep and draped in the usual fashion.  We made a posterolateral      approach to the hip.  Dissection was carried down through the skin and      subcutaneous tissue.  The proximal fascia was divided longitudinally, and      then we divided the gluteus maximus in line with its fibers.  The      mini-incision Charnley was used.  The bursa was divided.            We divided about 1 cm of the posterior gluteus medius.  We then divided off      the short external rotators.  Piriformis was divided near its insertion and      reflected.  The capsule was exposed and T'd.  This allowed [place] to dislocate      the hip.  We cut the hip approximately a centimeter above the trochanter      using a #13 flag for this purpose.  The head had an obvious defect in it      consistent with avascular necrosis, and the ball was rather eburnated      diffusely.  We went  ahead then and sized the head to about 50.  We reamed      sequentially up to a size 54.  He had a good bed of bleeding bone.  We      elected to use an outer diameter 54 mm metal acetabulum, and we impacted      this into place.  We then turned our attention to the femoral side.  We      reamed and rasped sequentially to 11, which gave a tight fit and filled the      proximal end of the femur very nicely.  We did a trial reduction, elected      to use a lateralized head and neck.  We were concerned that we may be a      little bit long.  We went ahead and we tried a variety of head and neck      sizes.            I went back to the acetabular side and reimpacted that.  We cleaned out the      femoral canal, and we impacted an 11-mm lateralized femoral prosthesis.      This went down 4 or 5 mm further than the other head and had excellent fit.      We then took an x-ray with the hip reduced with a standard head and neck      and had good limb length.  There was a suggestion slightly that the      acetabulum was not completely down, so I re-impacted that, but it was quite      rigid in its fixation.  We went ahead then and irrigated thoroughly.  We      put on a standard head and neck, reduced the hip, and we closed in layers.            We used 0 Vicryl for capsule, 0 Vicryl for gluteus medius and piriformis, 0      Vicryl for fascia, 2-0 Vicryl for subcutaneous tissue, and a 4-0 Monocryl      for skin with sterile tapes.  A sterile dressing was applied.  He was      placed supine.  An abduction pillow was applied.  He was awakened and      returned to recovery in good condition.                                          Electronic Signing MD: Rosezella Rumpf, MD            [id number]      Dictated:    08/06/2004 10:06 A      Transcribed: 08/06/2004  2:54 P      Job Number: [id number]      Document Number: [id number]            CC:  Rosezella Rumpf, MD

## 2012-07-14 ENCOUNTER — Other Ambulatory Visit (INDEPENDENT_AMBULATORY_CARE_PROVIDER_SITE_OTHER): Payer: Self-pay | Admitting: Family Medicine

## 2012-07-31 ENCOUNTER — Other Ambulatory Visit (INDEPENDENT_AMBULATORY_CARE_PROVIDER_SITE_OTHER): Payer: Self-pay | Admitting: Family Medicine

## 2012-07-31 NOTE — Telephone Encounter (Signed)
Patient informed, will call back to schedule appt. 

## 2012-10-15 ENCOUNTER — Other Ambulatory Visit: Payer: Self-pay | Admitting: Anesthesiology

## 2012-10-15 DIAGNOSIS — M1612 Unilateral primary osteoarthritis, left hip: Secondary | ICD-10-CM

## 2012-10-27 ENCOUNTER — Ambulatory Visit
Admission: RE | Admit: 2012-10-27 | Discharge: 2012-10-27 | Disposition: A | Discharge status: Home / Self Care | Payer: BC Managed Care – PPO | Source: Ambulatory Visit | Attending: Anesthesiology | Admitting: Anesthesiology

## 2012-10-27 ENCOUNTER — Encounter: Payer: Self-pay | Admitting: Anesthesiology

## 2012-10-27 ENCOUNTER — Other Ambulatory Visit: Payer: Self-pay | Admitting: Anesthesiology

## 2012-10-27 ENCOUNTER — Ambulatory Visit: Payer: BC Managed Care – PPO | Admitting: Anesthesiology

## 2012-10-27 DIAGNOSIS — M1612 Unilateral primary osteoarthritis, left hip: Secondary | ICD-10-CM

## 2012-10-27 DIAGNOSIS — M1610 Unilateral primary osteoarthritis, unspecified hip: Secondary | ICD-10-CM | POA: Insufficient documentation

## 2012-10-27 DIAGNOSIS — G8929 Other chronic pain: Secondary | ICD-10-CM | POA: Insufficient documentation

## 2012-10-27 DIAGNOSIS — M25559 Pain in unspecified hip: Secondary | ICD-10-CM | POA: Insufficient documentation

## 2012-10-27 DIAGNOSIS — M545 Low back pain, unspecified: Secondary | ICD-10-CM

## 2012-10-27 DIAGNOSIS — M87059 Idiopathic aseptic necrosis of unspecified femur: Secondary | ICD-10-CM | POA: Insufficient documentation

## 2012-10-27 MED ORDER — TRIAMCINOLONE ACETONIDE 40 MG/ML IJ SUSP
INTRAMUSCULAR | Status: DC | PRN
Start: 2012-10-27 — End: 2012-10-27
  Administered 2012-10-27: 80 mg via INTRA_ARTICULAR

## 2012-10-27 MED ORDER — LIDOCAINE HCL (PF) 2 % IJ SOLN
INTRAMUSCULAR | Status: DC | PRN
Start: 2012-10-27 — End: 2012-10-27
  Administered 2012-10-27: 10 mL via INTRADERMAL
  Administered 2012-10-27: 8 mL via EPIDURAL

## 2012-10-27 MED ORDER — IOHEXOL 180 MG/ML IJ SOLN
INTRAMUSCULAR | Status: DC | PRN
Start: 2012-10-27 — End: 2012-10-27
  Administered 2012-10-27: 3 mL via INTRAMUSCULAR

## 2012-10-27 NOTE — Progress Notes (Signed)
Pt tolerated procedure without difficulty, vss.  Pt able to ambulate without difficulty post procedure, pt stated that his pain was improved already.  Pt without questions regarding discharge instructions given.  Pt discharged to home with his wife.

## 2012-10-27 NOTE — Op Note (Signed)
Procedure Date: 10/27/2012     Patient Type: A     SURGEON: Caro Laroche MD  ASSISTANT:       PROCEDURE:  Interarticular left hip injection under fluoroscopic guidance.      PREOPERATIVE DIAGNOSES:  1.  Chronic left hip pain.  2.  Avascular necrosis of the left hip.  3.  History of right hip arthroplasty for avascular necrosis.     POSTOPERATIVE DIAGNOSES:  1.  Chronic left hip pain.  2.  Avascular necrosis of the left hip.  3.  History of right hip arthroplasty for avascular necrosis.     INDICATION FOR PROCEDURE:  This is a 57 year old gentleman with about 5-year history of left hip pain.   X-rays of the left hip showed some osteoarthritis with avascular necrosis.   The patient had good relief from steroid injection that was done elsewhere  about a year ago.  His pain has recurred.  This procedure is done today for  diagnostic and therapeutic purposes in the hope of providing the patient  with some pain relief and facilitating his daily activities, job  performance, and exercises.     DESCRIPTION OF PROCEDURE:  After obtaining informed consent and explaining to the patient about the  risks, benefits, and alternatives of procedure including but not limited to  bleeding, infection, allergic reaction, nerve injury, and failure of the  block at relieving his pain, the patient was taken to the procedure room  and placed in the supine position on the fluoroscopic table.  He was then  connected to the monitors.  The left hip area was prepped and draped.  The  skin and subcutaneous tissues overlying the left hip capsule was  anesthetized using 6 mL of 2% lidocaine.  A 22-gauge needle that is 3-1/2  inches long was inserted directly to the left hip joint under fluoroscopic  guidance.  After negative aspiration, 2 mL of Omnipaque 180 contrast dye  was injected and showed spread inside and around the hip capsule.  After  negative aspiration again, 8 mL of 2% lidocaine and 80 mg of Depo-Medrol  were injected through the needle.   The needle was withdrawn intact.  The  patient tolerated the procedure well and had significant decrease in his  pain level.     DISPOSITION:  After the appropriate recovery time, the patient was discharged home in a  stable condition in the company of his wife.  He will be contacted next  week for reassessment and consideration of other pain management therapies  if needed.           D:  10/27/2012 08:57 AM by Dr. Caro Laroche, MD ([id number])  T:  10/27/2012 09:40 AM by Minda Meo      (Conf: [id number]) (Doc ID: [id number])

## 2012-10-27 NOTE — Discharge Instructions (Signed)
Patient Identification  Jerry Hickman    57 y.o. male.  DOB:  15-Dec-1955      PAIN MANAGEMENT DISCHARGE INSTRUCTIONS  You have just experienced an outpatient procedure. We hope our visit was a pleasant one. Since you are continuing your recovery in the comfort of your own home, here are some instructions to assist you.    FOLLOW THE ITEMS BELOW:    You are advised to go directly home from the hospital, restrict your activities and rest for the remainder of the day.    Resume light activity as tolerated tomorrow. AVOID pulling , pushing, twisting, or carrying anything greater than 10 pounds.    Consult with your physician for more information regarding progression of activity.    Resume your regular diet.    MEDICATIONS  For mild discomfort, you may take Tylenol (two every four hours) or any non-prescription pain medication that you normally use, preferably one that does not contain aspirin.    Prescription provided, use as directed. When taking pain medications, you may experience dizziness or drowsiness. Do not drink alcohol or drive when you are taking these medications.    Resume your daily prescription medication schedule as directed by your Physician.     If you take Plavix, you may start tomorrow. If you take Coumadin, you may start it right away.    Other: ________________________________________________________    SPECIAL INSTRUCTIONS:  Observe the injection site/dressing. There should be no bleeding or swelling of the site. The injection site may be tender for a few days.     To minimize this, apply ice packs 10-15 minutes three times per day X 48 hours. No hot soaking baths for 48 hours. Quick warm shower is OK.     Discomfort may increase for 1-2 days following your pain block. Continue to take your pain medications as directed.          If you experience any of the following symptoms:   -   bleeding or swelling at injected site   -  increased numbness, weakness or severe pain   -  loss of bowel or  bladder control   -  headache which is unusually severe or lasts more than two days   -   other unexplained symptoms, problems, or concerns    Call the Pain Clinic at    [phone]   Monday through Friday 8 a.m to 4:30 pm.  At other times call   [phone]  and ask for the anesthesiologist on-call, or report to the Emergency Room.     FOLLOW UP CARE:  The nurse will call you for follow up in 1 week to 10 days.    Please call Dr. Aniceto Boss scheduler for your next appointment at telephone number [phone]     I have read the above instructions and agree to follow them:    ____________________________________  ___________________  Patient Signature     Date       Ernestine Conrad RN

## 2012-11-04 ENCOUNTER — Telehealth: Payer: Self-pay

## 2012-11-04 NOTE — Telephone Encounter (Signed)
Doing much better, pain score 2/10 generally. Walking more comfortably.  Will return as needed.

## 2012-11-13 ENCOUNTER — Telehealth (INDEPENDENT_AMBULATORY_CARE_PROVIDER_SITE_OTHER): Payer: Self-pay | Admitting: Family Medicine

## 2012-11-13 NOTE — Telephone Encounter (Signed)
Patient is an old patient of Dr. Lorrin Mais and would like a refill of Crestor.  Patient has an appointment coming up on August .  Please call and confirm with [email or web address].  The number is [phone].  The patient will be on vacation next week and he is requesting a refill.  I told him he would need an app

## 2012-11-18 ENCOUNTER — Telehealth (INDEPENDENT_AMBULATORY_CARE_PROVIDER_SITE_OTHER): Payer: Self-pay

## 2012-11-18 NOTE — Telephone Encounter (Signed)
Is the patient taking current medications?Yes  Has there been any changes to current medication list?no change    Document any barriers/adverse reactions to current medicationsnone  CURRENT BARRIERS TO TAKING MEDICATION:none      Has the patient sought any care outside of the [facility]?Yes Patient instructed to bring any self-referral information to the appointment.

## 2012-11-23 ENCOUNTER — Other Ambulatory Visit (INDEPENDENT_AMBULATORY_CARE_PROVIDER_SITE_OTHER): Payer: Self-pay

## 2012-11-23 MED ORDER — AMLODIPINE BESYLATE 10 MG PO TABS
10.0000 mg | ORAL_TABLET | Freq: Every day | ORAL | Status: DC
Start: 2012-11-23 — End: 2012-12-01

## 2012-11-23 NOTE — Telephone Encounter (Signed)
Message copied by Selinda Orion on Mon Nov 23, 2012 11:06 AM  ------       Message from: Zebedee Iba       Created: Mon Nov 23, 2012 10:49 AM       Contact: [phone]         Pt has been out for a week and "has a wicked headache" wnts to know if we can refill his rx to last him until his apt next week with dr Lorrin Mais              amLODIPine (NORVASC) 10 MG tablet       1 daily              [id number] - [place], [place] - [id number] AT [place] OF [facility] [address] [phone]

## 2012-12-01 ENCOUNTER — Ambulatory Visit (INDEPENDENT_AMBULATORY_CARE_PROVIDER_SITE_OTHER): Payer: BC Managed Care – PPO | Admitting: Family Medicine

## 2012-12-01 ENCOUNTER — Telehealth (INDEPENDENT_AMBULATORY_CARE_PROVIDER_SITE_OTHER): Payer: Self-pay

## 2012-12-01 ENCOUNTER — Encounter (INDEPENDENT_AMBULATORY_CARE_PROVIDER_SITE_OTHER): Payer: Self-pay

## 2012-12-01 VITALS — BP 138/88 | HR 61 | Temp 97.4°F | Ht 68.7 in | Wt 229.8 lb

## 2012-12-01 DIAGNOSIS — Z1211 Encounter for screening for malignant neoplasm of colon: Secondary | ICD-10-CM

## 2012-12-01 DIAGNOSIS — Z1331 Encounter for screening for depression: Secondary | ICD-10-CM

## 2012-12-01 DIAGNOSIS — K219 Gastro-esophageal reflux disease without esophagitis: Secondary | ICD-10-CM | POA: Insufficient documentation

## 2012-12-01 DIAGNOSIS — E785 Hyperlipidemia, unspecified: Secondary | ICD-10-CM

## 2012-12-01 DIAGNOSIS — T148XXA Other injury of unspecified body region, initial encounter: Secondary | ICD-10-CM

## 2012-12-01 DIAGNOSIS — R234 Changes in skin texture: Secondary | ICD-10-CM

## 2012-12-01 DIAGNOSIS — I10 Essential (primary) hypertension: Secondary | ICD-10-CM

## 2012-12-01 HISTORY — DX: Encounter for screening for depression: Z13.31

## 2012-12-01 MED ORDER — PANTOPRAZOLE SODIUM 20 MG PO TBEC
20.0000 mg | DELAYED_RELEASE_TABLET | Freq: Every day | ORAL | Status: DC
Start: 2012-12-01 — End: 2013-05-14

## 2012-12-01 MED ORDER — ROSUVASTATIN CALCIUM 10 MG PO TABS
10.0000 mg | ORAL_TABLET | Freq: Every day | ORAL | Status: DC
Start: 2012-12-01 — End: 2013-05-11

## 2012-12-01 MED ORDER — AMLODIPINE BESYLATE 10 MG PO TABS
10.0000 mg | ORAL_TABLET | Freq: Every day | ORAL | Status: DC
Start: 2012-12-01 — End: 2012-12-23

## 2012-12-01 NOTE — Telephone Encounter (Signed)
PA for Crestor approved eff 12/01/12 to 12/02/14.  Pt has tried and failed atorvastatin and pravastatin in past.  Pt did not have adequate lowering of numbers.  Pt has been on Crestor x >2 years

## 2012-12-01 NOTE — Progress Notes (Signed)
1. Have you self referred yourself since we last saw you?    Refer to care team   Or  Add specialists:    No      Pt aware due for colonoscopy

## 2012-12-01 NOTE — Progress Notes (Signed)
Subjective:       Patient ID: Jerry Hickman is a 57 y.o. male.    HPI    1.  The patient is here for a follow up of his hypertension.  He is generally running about 130-135/upper 80s.  He is exercising a lot. He is doing daily on the ellepltical, and is very committed to doing this.  He has been able to do so since his knee replacement.    2. He is here for follow up of his hyperlipidemia.  He is doing well with the medication.  He is having no cramping.  He is watching his diet.    3.  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  He was treated with MTX and prednisone and is doing well.  He was seeing a neuro ophthalmologist.  This also seemed to bother is joints, but now his knee is doing better.  He may need a left hip replacement in November, and probably need a knee replacement.  His shoulders are doing well.    4. He is doing well with his reflux.  Now that he is off the MTX and prednisone, he is not really having problems    Review of Systems   Constitutional: Negative.  Negative for activity change, appetite change, fatigue and unexpected weight change.   HENT: Negative.  Negative for hearing loss, ear pain, trouble swallowing and neck pain.    Eyes: Negative.  Negative for pain and visual disturbance.   Respiratory: Negative.  Negative for cough, chest tightness and shortness of breath.    Cardiovascular: Negative.  Negative for chest pain and palpitations.   Gastrointestinal: Negative.  Negative for abdominal pain, diarrhea, constipation and blood in stool.   Genitourinary: Negative.  Negative for dysuria, urgency, frequency and hematuria.        Long term nocturia     Musculoskeletal: Positive for arthralgias. Negative for myalgias.   Skin: Negative.         He has some skin changes   Neurological: Negative.  Negative for dizziness, tremors, syncope, weakness, numbness and headaches.   Hematological: Negative.  Negative for adenopathy.   Psychiatric/Behavioral: Negative.  Negative for  confusion and dysphoric mood. The patient is not nervous/anxious.      The following portions of the patient's history were reviewed and updated as appropriate:  current medications, allergies,  past medical history, past social history, past surgical history and problem list.    Current Outpatient Prescriptions on File Prior to Visit   Medication Sig Dispense Refill   . [DISCONTINUED] amLODIPine (NORVASC) 10 MG tablet Take 1 tablet (10 mg total) by mouth daily.  14 tablet  0   . [DISCONTINUED] CRESTOR 10 MG tablet TAKE 1 TABLET BY MOUTH EVERY DAY  90 tablet  0   . [DISCONTINUED] pantoprazole (PROTONIX) 20 MG tablet TAKE 1 TABLET BY MOUTH EVERY DAY  90 tablet  1   . [DISCONTINUED] traMADol (ULTRAM) 50 MG tablet Take 50 mg by mouth every 6 (six) hours as needed.               Objective:    Physical Exam   Constitutional: He is oriented to person, place, and time. He appears well-developed and well-nourished. No distress.   HENT:   Head: Normocephalic and atraumatic.   Right Ear: External ear normal.   Left Ear: External ear normal.   Nose: Nose normal.   Mouth/Throat: Oropharynx is clear and moist. No oropharyngeal  exudate.   Eyes: Conjunctivae normal and EOM are normal. Pupils are equal, round, and reactive to light. Right eye exhibits no discharge. No scleral icterus.   Neck: Neck supple. No JVD present. No thyromegaly present.   Cardiovascular: Normal rate, regular rhythm and normal heart sounds.  Exam reveals no gallop and no friction rub.    No murmur heard.  Pulmonary/Chest: Effort normal and breath sounds normal. No respiratory distress. He has no wheezes. He has no rales.   Abdominal: Soft. Bowel sounds are normal. He exhibits no distension. There is no tenderness.   Musculoskeletal: Normal range of motion. He exhibits no edema and no tenderness.   Lymphadenopathy:     He has no cervical adenopathy.   Neurological: He is alert and oriented to person, place, and time. No cranial nerve deficit.   Skin: Skin is  warm and dry. No rash noted. He is not diaphoretic.   Psychiatric: He has a normal mood and affect.           Assessment:       1.  Hypertension.  this is generally under good control and knee are to goal. He is checking at home periodically. He is taking his medications regularly. He is very active. Overall he is doing well. We discussed that his goal for his blood pressure is more towards 130/80. He will start checking at home more frequently. At home it is more towards goal. I will make no changes at this time.    2. Hyperlipidemia. He is doing quite well with his Crestor. He is having no muscle cramping. He is adherent to his diet. He understands the need for keeping this as well as high blood pressure under good control. Also we discussed his risk factors and I have recommended that he take a baby aspirin daily.    3. Skin changes. He is having some bruising as well as some photo damage. I will have him see the dermatologist. I've also asked him to monitor for increasing bruising with the baby aspirin. Also due to the bruising I will check a CBC today.    4. Reflux. He is doing very well with this. He takes the Protonix daily just because it's in the box. I have recommended decreasing the use of this to every other day and if doing well then as needed. We discussed the potential side effects of long-term PPI use. Also check a magnesium levels today. We discussed  potential problems with chronic use.    5. He is due for his colonoscopy. He is aware of this. We'll give him a name for Dr. Leeanne Deed him to follow up with again.  Plan:       1.  Continue his current medications.  2. Continue with diet and exercise.  3. Monitor blood pressure at home at least twice a week.  4. Screening colonoscopy.  5. CBC, CMP, lipids, magnesium.  6. Follow up with the dermatologist.  7. Start aspirin 81 mg daily. If he has more bruising to stop it.  8. Decrease Protonix to every other day. If tolerated then use on an as-needed  basis.  9. Follow up with me in 3-6 months. At that time he schedule full physical.  10. HIgh BMI Follow Up  BMI Follow Up Care Plan Documented  Encouragement to Exercise    Quality Measure: Controlling Blood Pressure for patient with Hypertension.  Document follow-up if necessary.    The most frequent and serious complications of uncontrolled  hypertension include coronary heart disease, congestive heart failure, stroke, ruptured aortic aneurysm, renal disease, and retinopathy. The increased risks of hypertension are present in individuals ranging from 28 to 89 years of age. For every 20 mmHg systolic or 10 mmHg diastolic increase in blood pressure, there is a doubling of mortality from both ischemic heart disease and stroke [employer] on Prevention, Detection, Evaluation, and Treatment of High Blood Pressure 2003). Better control of blood pressure has been shown to significantly reduce the probability that these undesirable and costly outcomes will occur. The relationship between the measure (control of hypertension) and the long-term clinical outcomes listed is well established. In clinical trials, antihypertensive therapy has been associated with reductions in stroke incidence (35-40 percent), myocardial infarction incidence (20-25 percent) and heart failure incidence (>50 percent) (Joint National Committee on Prevention, Detection, Evaluation, and Treatment of High Blood Pressure 2003).    The [employer] (2007) recommends screening for high blood pressure in adults age 47 years and older. This is a grade A recommendation.     Joint National Committee on Prevention, Detection, Evaluation, and Treatment of High Blood Pressure (2003): Treating systolic blood pressure and diastolic blood pressure to targets that are <140/90 mmHg is associated with a decrease in cardiovascular disease complications.    Guideline developer: [employer] for [employer]) of Funding: Undetermined  Measure Steward: [employer] for [employer]  Date Released:  April 2013    Override:Health Maintenance Plan for Controlling BP for Hypertension.

## 2012-12-02 LAB — LIPID PANEL
Cholesterol / HDL Ratio: 2.4 (ref 0.0–5.0)
Cholesterol: 192 MG/DL (ref 125–200)
HDL: 80 mg/dL (ref >=40)
LDL Calculated: 90 mg/dL (ref <130)
Non HDL Cholesterol (LDL and VLDL): 112 mg/dL
Triglycerides: 112 MG/DL (ref <150)

## 2012-12-02 LAB — CBC AND DIFFERENTIAL
Atypical Lymphocytes %: 0 %
Baso(Absolute): 28 cells/uL (ref 0–200)
Basophils: 0.5 %
Eosinophils Absolute: 50 cells/uL (ref 15–500)
Eosinophils: 0.9 %
Hematocrit: 43.8 % (ref 38.5–50.0)
Hemoglobin: 14.9 g/dL (ref 13.2–17.1)
Lymphocytes Absolute: 1738 cells/uL (ref 850–3900)
Lymphocytes: 31.6 %
MCH: 31.7 pg (ref 27–33)
MCHC: 33.9 g/dL (ref 32–36)
MCV: 93 fL (ref 80–100)
MPV: 8.6 fL (ref 7.5–11.5)
Monocytes Absolute: 424 cells/uL (ref 200–950)
Monocytes: 7.7 %
Neutrophils Absolute: 3262 cells/uL (ref 1500–7800)
Neutrophils: 59.3 %
Platelets: 180 10*3/uL (ref 140–400)
RBC: 4.69 10*6/uL (ref 4.20–5.80)
RDW: 14.8 % (ref 11.0–15.0)
WBC: 5.5 10*3/uL (ref 3.8–10.8)

## 2012-12-02 LAB — COMPREHENSIVE METABOLIC PANEL
ALT: 39 U/L (ref 9–46)
AST (SGOT): 22 U/L (ref 10–35)
Albumin/Globulin Ratio: 2 (ref 1.0–2.5)
Albumin: 4.6 G/DL (ref 3.6–5.1)
Alkaline Phosphatase: 58 U/L (ref 40–115)
BUN: 18 MG/DL (ref 7–25)
Bilirubin, Total: 0.7 MG/DL (ref 0.2–1.2)
CO2: 25 mmol/L (ref 19–30)
Calcium: 9.4 MG/DL (ref 8.6–10.3)
Chloride: 104 mmol/L (ref 98–110)
Creatinine: 1.14 mg/dL (ref 0.70–1.33)
EGFR African American: 83 mL/min/{1.73_m2} (ref >=60)
EGFR: 72 mL/min/{1.73_m2} (ref >=60)
Globulin: 2.3 G/DL (ref 1.9–3.7)
Glucose: 100 MG/DL — ABNORMAL HIGH (ref 65–99)
Potassium: 4.4 mmol/L (ref 3.5–5.3)
Protein, Total: 6.9 G/DL (ref 6.1–8.1)
Sodium: 138 mmol/L (ref 135–146)

## 2012-12-02 LAB — MAGNESIUM: Magnesium: 2.2 mg/dL (ref 1.5–2.5)

## 2012-12-14 ENCOUNTER — Encounter (INDEPENDENT_AMBULATORY_CARE_PROVIDER_SITE_OTHER): Payer: Self-pay

## 2012-12-23 ENCOUNTER — Other Ambulatory Visit (INDEPENDENT_AMBULATORY_CARE_PROVIDER_SITE_OTHER): Payer: Self-pay

## 2012-12-23 DIAGNOSIS — I10 Essential (primary) hypertension: Secondary | ICD-10-CM

## 2012-12-23 MED ORDER — AMLODIPINE BESYLATE 10 MG PO TABS
10.0000 mg | ORAL_TABLET | Freq: Every day | ORAL | Status: DC
Start: 2012-12-23 — End: 2013-05-14

## 2013-02-08 ENCOUNTER — Encounter (INDEPENDENT_AMBULATORY_CARE_PROVIDER_SITE_OTHER): Payer: Self-pay

## 2013-02-18 ENCOUNTER — Encounter (INDEPENDENT_AMBULATORY_CARE_PROVIDER_SITE_OTHER): Payer: Self-pay | Admitting: Family Medicine

## 2013-02-18 ENCOUNTER — Ambulatory Visit (INDEPENDENT_AMBULATORY_CARE_PROVIDER_SITE_OTHER): Payer: BC Managed Care – PPO | Admitting: Family Medicine

## 2013-02-18 VITALS — BP 145/67 | HR 71 | Temp 98.0°F | Ht 68.7 in | Wt 234.0 lb

## 2013-02-18 DIAGNOSIS — E785 Hyperlipidemia, unspecified: Secondary | ICD-10-CM

## 2013-02-18 DIAGNOSIS — I10 Essential (primary) hypertension: Secondary | ICD-10-CM

## 2013-02-18 DIAGNOSIS — M1990 Unspecified osteoarthritis, unspecified site: Secondary | ICD-10-CM

## 2013-02-18 DIAGNOSIS — Z01818 Encounter for other preprocedural examination: Secondary | ICD-10-CM

## 2013-02-18 NOTE — Progress Notes (Signed)
Subjective:       Patient ID: Jerry Hickman is a 57 y.o. male.    HPI    The patient is here at the request of Dr. Rayvon Char for pre op evaluation for left hip replacement on 03/10/13.  He ha a history of DJD.  He is having increasing pain, sleeplessness and pain interfering with activities of daily living.  He has had his right hip replaced 8 years ago.  This is doing well.  He has also had a right knee replaced, but is not doing as well.      He has a history of neuorsarcoid and was treated with long term prednisone then methotrexate.  The sarcoid is now doing fine.    He has had no problems with anesthesia in the past .  He has no history of bleeding problems.  He has no family history of anesthesia or bleeding problems.    He has no know history of sleep apnea, but does snore.  He is not rested in the morning.  He does wake up frequently during the night due to his pain.  The snoring is more related to alcohol intake than his usual sleep pattern (therefore this is not nightly).    His blood pressure at home has been good.  It is generally running 135/80.    No Known Allergies    Past Medical History   Diagnosis Date   . Hyperlipidemia    . Hypertensive disorder    . GERD (gastroesophageal reflux disease)    . Depression screening negative 12-01-12     Neg   . H/O splenomegaly    . Chronic hip pain        History     Social History   . Marital Status: Married     Spouse Name: N/A     Number of Children: N/A   . Years of Education: N/A     Occupational History   . Not on file.     Social History Main Topics   . Smoking status: Never Smoker    . Smokeless tobacco: Never Used   . Alcohol Use: 2.0 oz/week     4 drink(s) per week   . Drug Use: No   . Sexually Active: Not on file     Other Topics Concern   . Not on file     Social History Narrative   . No narrative on file       Past Surgical History   Procedure Date   . Replacement total knee 01-18-11     right   . Total hip arthroplasty 11-2004     Right       Patient  Active Problem List   Diagnosis   . History of sarcoidosis   . Essential hypertension, benign   . Mixed hyperlipidemia   . ED (erectile dysfunction)   . GERD (gastroesophageal reflux disease)       Current outpatient prescriptions:amLODIPine (NORVASC) 10 MG tablet, Take 1 tablet (10 mg total) by mouth daily., Disp: 90 tablet, Rfl: 1;  pantoprazole (PROTONIX) 20 MG tablet, Take 1 tablet (20 mg total) by mouth daily., Disp: 90 tablet, Rfl: 3;  rosuvastatin (CRESTOR) 10 MG tablet, Take 1 tablet (10 mg total) by mouth daily., Disp: 90 tablet, Rfl: 3;  traMADol (ULTRAM) 50 MG tablet, , Disp: , Rfl:         Review of Systems   Constitutional: Negative.  Negative for activity change, appetite change,  fatigue and unexpected weight change.   HENT: Negative.  Negative for ear pain, hearing loss and trouble swallowing.    Eyes: Negative.  Negative for pain and visual disturbance.   Respiratory: Negative.  Negative for cough, chest tightness and shortness of breath.    Cardiovascular: Negative.  Negative for chest pain and palpitations.   Gastrointestinal: Negative.  Negative for abdominal pain, diarrhea, constipation and blood in stool.   Genitourinary: Negative.  Negative for dysuria, urgency, frequency and hematuria.   Musculoskeletal: Positive for arthralgias and joint swelling. Negative for myalgias and neck pain.   Skin: Negative.    Neurological: Negative.  Negative for dizziness, tremors, syncope, weakness, numbness and headaches.   Hematological: Negative.  Negative for adenopathy.   Psychiatric/Behavioral: Negative.  Negative for confusion and dysphoric mood. The patient is not nervous/anxious.            Objective:    Physical Exam   Constitutional: He is oriented to person, place, and time. He appears well-developed and well-nourished. No distress.   HENT:   Head: Normocephalic and atraumatic.   Right Ear: External ear normal.   Left Ear: External ear normal.   Nose: Nose normal.   Mouth/Throat: Oropharynx is clear  and moist. No oropharyngeal exudate.   Eyes: Conjunctivae normal and EOM are normal. Pupils are equal, round, and reactive to light. Right eye exhibits no discharge. No scleral icterus.   Neck: Neck supple. No JVD present. No thyromegaly present.   Cardiovascular: Normal rate, regular rhythm and normal heart sounds.  Exam reveals no gallop and no friction rub.    No murmur heard.  Pulmonary/Chest: Effort normal and breath sounds normal. No respiratory distress. He has no wheezes. He has no rales.   Abdominal: Soft. Bowel sounds are normal. He exhibits no distension. There is no tenderness.   Musculoskeletal: Normal range of motion. He exhibits no edema and no tenderness.   Lymphadenopathy:     He has no cervical adenopathy.   Neurological: He is alert and oriented to person, place, and time. No cranial nerve deficit.   Skin: Skin is warm and dry. No rash noted. He is not diaphoretic.   Psychiatric: He has a normal mood and affect.     Blood pressure 145/67, pulse 71, temperature 98 F (36.7 C), temperature source Oral, height 1.745 m (5' 8.7"), weight 106.142 kg (234 lb).        Assessment:       Pre op evaluation.  Overall he is doing well except for his orthopedic problems.  He is having no significant cardiovascular or pulmonary signs or symptoms.  He has no history of bleeding or anesthesia problems.  He does have a history of snoring and not being rested in the morning, but the fatigue is probably more due to not sleeping well at night due to his pain.  He has no history of problems with surgery.  He is medically stable.  EKG: NSR, normal tracing      Plan:       1.  Cbc, cmp, MRSA culture  2.   Continue his current medications  3.  No anti-inflammatories for the week prior to surgery  4.  He is medically cleared for surgery.

## 2013-02-18 NOTE — Progress Notes (Signed)
1. Have you self referred yourself since we last saw you?    Refer to care team   Or  Add specialists:    No

## 2013-02-19 ENCOUNTER — Ambulatory Visit: Payer: BC Managed Care – PPO

## 2013-02-19 LAB — COMPREHENSIVE METABOLIC PANEL
ALT: 32 U/L (ref 9–46)
AST (SGOT): 22 U/L (ref 10–35)
Albumin/Globulin Ratio: 1.7 (ref 1.0–2.5)
Albumin: 4.8 G/DL (ref 3.6–5.1)
Alkaline Phosphatase: 65 U/L (ref 40–115)
BUN: 18 MG/DL (ref 7–25)
Bilirubin, Total: 0.7 MG/DL (ref 0.2–1.2)
CO2: 26 mmol/L (ref 19–30)
Calcium: 9.5 MG/DL (ref 8.6–10.3)
Chloride: 103 mmol/L (ref 98–110)
Creatinine: 1.24 mg/dL (ref 0.70–1.33)
EGFR African American: 75 mL/min/{1.73_m2} (ref >=60)
EGFR: 65 mL/min/{1.73_m2} (ref >=60)
Globulin: 2.8 G/DL (ref 1.9–3.7)
Glucose: 93 MG/DL (ref 65–99)
Potassium: 4.7 mmol/L (ref 3.5–5.3)
Protein, Total: 7.6 G/DL (ref 6.1–8.1)
Sodium: 140 mmol/L (ref 135–146)

## 2013-02-19 LAB — CBC AND DIFFERENTIAL
Atypical Lymphocytes %: 0 %
Baso(Absolute): 17 cells/uL (ref 0–200)
Basophils: 0.3 %
Eosinophils Absolute: 116 cells/uL (ref 15–500)
Eosinophils: 2 %
Hematocrit: 45 % (ref 38.5–50.0)
Hemoglobin: 15.2 g/dL (ref 13.2–17.1)
Lymphocytes Absolute: 1763 cells/uL (ref 850–3900)
Lymphocytes: 30.4 %
MCH: 31.9 pg (ref 27–33)
MCHC: 33.8 g/dL (ref 32–36)
MCV: 94 fL (ref 80–100)
MPV: 8.8 fL (ref 7.5–11.5)
Monocytes Absolute: 441 cells/uL (ref 200–950)
Monocytes: 7.6 %
Neutrophils Absolute: 3463 cells/uL (ref 1500–7800)
Neutrophils: 59.7 %
Platelets: 187 10*3/uL (ref 140–400)
RBC: 4.76 10*6/uL (ref 4.20–5.80)
RDW: 13.6 % (ref 11.0–15.0)
WBC: 5.8 10*3/uL (ref 3.8–10.8)

## 2013-02-19 LAB — LIPID PANEL
Cholesterol / HDL Ratio: 2.8 (ref 0.0–5.0)
Cholesterol: 198 MG/DL (ref 125–200)
HDL: 71 mg/dL (ref >=40)
LDL Calculated: 107 mg/dL (ref <130)
Non HDL Cholesterol (LDL and VLDL): 127 mg/dL
Triglycerides: 98 MG/DL (ref <150)

## 2013-02-19 NOTE — Pre-Procedure Instructions (Signed)
Faxed surgeon for Lab including MRSA, EKG, Clearance and consent 11/14

## 2013-02-21 LAB — MRSA CULTURE

## 2013-02-24 ENCOUNTER — Other Ambulatory Visit (INDEPENDENT_AMBULATORY_CARE_PROVIDER_SITE_OTHER): Payer: Self-pay

## 2013-02-24 DIAGNOSIS — E785 Hyperlipidemia, unspecified: Secondary | ICD-10-CM

## 2013-02-24 NOTE — Telephone Encounter (Signed)
Message copied by Selinda Orion on Wed Feb 24, 2013  1:59 PM  ------       Message from: Birdena Jubilee       Created: Wed Feb 24, 2013  1:34 PM       Regarding: rx dr Lorrin Mais pt       Contact: [phone]         Crestor is costing the pt $400.  Would like an alternative              [id number] - [place], [place] - [id number] AT [facility] OF [facility] [phone] (Phone)       [phone] (Fax)

## 2013-02-26 MED ORDER — ATORVASTATIN CALCIUM 10 MG PO TABS
10.0000 mg | ORAL_TABLET | Freq: Every day | ORAL | Status: DC
Start: 2013-02-26 — End: 2013-05-03

## 2013-02-26 NOTE — Telephone Encounter (Signed)
We can try him on the generic lipitor at 10 mg, and recheck his labs in about 6 weeks.

## 2013-02-26 NOTE — Telephone Encounter (Signed)
Left detailed message informing patient. Please send.

## 2013-03-03 NOTE — Pre-Procedure Instructions (Signed)
Faxed surgeon for Lab including MRSA, EKG, Clearance 11/26

## 2013-03-10 ENCOUNTER — Inpatient Hospital Stay: Payer: BC Managed Care – PPO

## 2013-03-10 ENCOUNTER — Inpatient Hospital Stay
Admission: RE | Admit: 2013-03-10 | Discharge: 2013-03-11 | DRG: 470 | Disposition: A | Discharge status: Home / Self Care | Payer: BC Managed Care – PPO | Source: Ambulatory Visit | Attending: Orthopaedic Surgery | Admitting: Orthopaedic Surgery

## 2013-03-10 ENCOUNTER — Inpatient Hospital Stay: Payer: BC Managed Care – PPO | Admitting: Physician Assistant

## 2013-03-10 ENCOUNTER — Encounter
Admission: RE | Disposition: A | Discharge status: Home / Self Care | Payer: Self-pay | Source: Ambulatory Visit | Attending: Orthopaedic Surgery

## 2013-03-10 ENCOUNTER — Ambulatory Visit: Payer: Self-pay

## 2013-03-10 ENCOUNTER — Encounter: Payer: Self-pay | Admitting: Physician Assistant

## 2013-03-10 ENCOUNTER — Inpatient Hospital Stay: Payer: BC Managed Care – PPO | Admitting: Orthopaedic Surgery

## 2013-03-10 DIAGNOSIS — Z96649 Presence of unspecified artificial hip joint: Secondary | ICD-10-CM

## 2013-03-10 DIAGNOSIS — R161 Splenomegaly, not elsewhere classified: Secondary | ICD-10-CM | POA: Diagnosis present

## 2013-03-10 DIAGNOSIS — R262 Difficulty in walking, not elsewhere classified: Secondary | ICD-10-CM | POA: Diagnosis present

## 2013-03-10 DIAGNOSIS — K219 Gastro-esophageal reflux disease without esophagitis: Secondary | ICD-10-CM | POA: Diagnosis present

## 2013-03-10 DIAGNOSIS — Z8249 Family history of ischemic heart disease and other diseases of the circulatory system: Secondary | ICD-10-CM

## 2013-03-10 DIAGNOSIS — E785 Hyperlipidemia, unspecified: Secondary | ICD-10-CM | POA: Diagnosis present

## 2013-03-10 DIAGNOSIS — I10 Essential (primary) hypertension: Secondary | ICD-10-CM | POA: Diagnosis present

## 2013-03-10 DIAGNOSIS — M1610 Unilateral primary osteoarthritis, unspecified hip: Principal | ICD-10-CM | POA: Diagnosis present

## 2013-03-10 DIAGNOSIS — Z96659 Presence of unspecified artificial knee joint: Secondary | ICD-10-CM

## 2013-03-10 HISTORY — PX: ARTHROPLASTY, HIP, TOTAL, ANTERIOR APPROACH, C ARM: SHX3116

## 2013-03-10 HISTORY — DX: Unspecified osteoarthritis, unspecified site: M19.90

## 2013-03-10 HISTORY — DX: Difficulty in walking, not elsewhere classified: R26.2

## 2013-03-10 HISTORY — DX: Unspecified visual disturbance: H53.9

## 2013-03-10 LAB — TYPE AND SCREEN
AB Screen Gel: NEGATIVE
ABO Rh: A POS

## 2013-03-10 LAB — LAB USE ONLY - HISTORICAL SURGICAL PATHOLOGY

## 2013-03-10 SURGERY — ARTHROPLASTY, HIP, TOTAL, ANTERIOR APPROACH, C ARM
Anesthesia: Anesthesia General | Site: Hip | Laterality: Left | Wound class: Clean

## 2013-03-10 MED ORDER — SODIUM CHLORIDE 0.45 % IV SOLN
INTRAVENOUS | Status: DC
Start: 2013-03-10 — End: 2013-03-11

## 2013-03-10 MED ORDER — LACTATED RINGERS IV SOLN
INTRAVENOUS | Status: DC
Start: 2013-03-10 — End: 2013-03-11

## 2013-03-10 MED ORDER — ZOLPIDEM TARTRATE 5 MG PO TABS
10.0000 mg | ORAL_TABLET | Freq: Every evening | ORAL | Status: DC | PRN
Start: 2013-03-10 — End: 2013-03-11
  Administered 2013-03-11: 10 mg via ORAL
  Filled 2013-03-10: qty 2

## 2013-03-10 MED ORDER — HYDROMORPHONE HCL PF 1 MG/ML IJ SOLN
INTRAMUSCULAR | Status: DC | PRN
Start: 2013-03-10 — End: 2013-03-10
  Administered 2013-03-10: .4 mg via INTRAVENOUS
  Administered 2013-03-10: .6 mg via INTRAVENOUS
  Administered 2013-03-10: .4 mg via INTRAVENOUS
  Administered 2013-03-10: .6 mg via INTRAVENOUS

## 2013-03-10 MED ORDER — AMLODIPINE BESYLATE 5 MG PO TABS
10.0000 mg | ORAL_TABLET | Freq: Every day | ORAL | Status: DC
Start: 2013-03-10 — End: 2013-03-11
  Administered 2013-03-11: 10 mg via ORAL
  Filled 2013-03-10: qty 2

## 2013-03-10 MED ORDER — ACETAMINOPHEN 325 MG PO TABS
650.0000 mg | ORAL_TABLET | ORAL | Status: DC | PRN
Start: 2013-03-10 — End: 2013-03-11

## 2013-03-10 MED ORDER — OXYCODONE HCL ER 10 MG PO T12A
10.0000 mg | EXTENDED_RELEASE_TABLET | Freq: Once | ORAL | Status: AC
Start: 2013-03-10 — End: 2013-03-10
  Administered 2013-03-10: 10 mg via ORAL

## 2013-03-10 MED ORDER — FENTANYL CITRATE 0.05 MG/ML IJ SOLN
INTRAMUSCULAR | Status: AC
Start: 2013-03-10 — End: ?
  Filled 2013-03-10: qty 5

## 2013-03-10 MED ORDER — OXYCODONE-ACETAMINOPHEN 5-325 MG PO TABS
2.0000 | ORAL_TABLET | ORAL | Status: DC | PRN
Start: 2013-03-10 — End: 2013-03-11
  Administered 2013-03-10 – 2013-03-11 (×5): 2 via ORAL
  Filled 2013-03-10 (×5): qty 2

## 2013-03-10 MED ORDER — ONDANSETRON HCL 4 MG/2ML IJ SOLN
INTRAMUSCULAR | Status: DC | PRN
Start: 2013-03-10 — End: 2013-03-10
  Administered 2013-03-10: 4 mg via INTRAVENOUS

## 2013-03-10 MED ORDER — DEXAMETHASONE SODIUM PHOSPHATE 4 MG/ML IJ SOLN (WRAP)
INTRAMUSCULAR | Status: DC | PRN
Start: 2013-03-10 — End: 2013-03-10
  Administered 2013-03-10: 8 mg via INTRAVENOUS

## 2013-03-10 MED ORDER — ONDANSETRON HCL 4 MG/2ML IJ SOLN
4.0000 mg | Freq: Three times a day (TID) | INTRAMUSCULAR | Status: DC | PRN
Start: 2013-03-10 — End: 2013-03-11

## 2013-03-10 MED ORDER — SODIUM CHLORIDE 0.9 % IJ SOLN
INTRAMUSCULAR | Status: AC
Start: 2013-03-10 — End: ?
  Filled 2013-03-10: qty 30

## 2013-03-10 MED ORDER — EPINEPHRINE HCL 1 MG/ML IJ SOLN
INTRAMUSCULAR | Status: AC
Start: 2013-03-10 — End: ?
  Filled 2013-03-10: qty 1

## 2013-03-10 MED ORDER — SENNOSIDES-DOCUSATE SODIUM 8.6-50 MG PO TABS
2.0000 | ORAL_TABLET | Freq: Two times a day (BID) | ORAL | Status: DC | PRN
Start: 2013-03-10 — End: 2013-03-11

## 2013-03-10 MED ORDER — PREGABALIN 75 MG PO CAPS
75.0000 mg | ORAL_CAPSULE | Freq: Once | ORAL | Status: AC
Start: 2013-03-10 — End: 2013-03-10
  Administered 2013-03-10: 75 mg via ORAL

## 2013-03-10 MED ORDER — HYDROMORPHONE HCL PF 1 MG/ML IJ SOLN
1.0000 mg | INTRAMUSCULAR | Status: DC | PRN
Start: 2013-03-10 — End: 2013-03-11
  Administered 2013-03-10 (×3): 1 mg via INTRAVENOUS
  Filled 2013-03-10 (×3): qty 1

## 2013-03-10 MED ORDER — ACETAMINOPHEN 500 MG PO TABS
1000.0000 mg | ORAL_TABLET | Freq: Once | ORAL | Status: AC
Start: 2013-03-10 — End: 2013-03-10
  Administered 2013-03-10: 1000 mg via ORAL

## 2013-03-10 MED ORDER — SUCCINYLCHOLINE CHLORIDE 20 MG/ML IJ SOLN
INTRAMUSCULAR | Status: AC
Start: 2013-03-10 — End: ?
  Filled 2013-03-10: qty 10

## 2013-03-10 MED ORDER — CELECOXIB 100 MG PO CAPS
400.0000 mg | ORAL_CAPSULE | Freq: Once | ORAL | Status: AC
Start: 2013-03-10 — End: 2013-03-10
  Administered 2013-03-10: 400 mg via ORAL

## 2013-03-10 MED ORDER — PREGABALIN 75 MG PO CAPS
ORAL_CAPSULE | ORAL | Status: AC
Start: 2013-03-10 — End: ?
  Filled 2013-03-10: qty 1

## 2013-03-10 MED ORDER — RIVAROXABAN 10 MG PO TABS
10.0000 mg | ORAL_TABLET | Freq: Every morning | ORAL | Status: DC
Start: 2013-03-11 — End: 2013-03-11
  Administered 2013-03-11: 10 mg via ORAL
  Filled 2013-03-10: qty 1

## 2013-03-10 MED ORDER — ACETAMINOPHEN 325 MG PO TABS
650.0000 mg | ORAL_TABLET | ORAL | Status: DC
Start: 2013-03-10 — End: 2013-03-10

## 2013-03-10 MED ORDER — MORPHINE SULFATE (PF) 10 MG/ML IV SOLN
INTRAVENOUS | Status: DC | PRN
Start: 2013-03-10 — End: 2013-03-10
  Administered 2013-03-10: 10 mg via INTRAVENOUS

## 2013-03-10 MED ORDER — LABETALOL HCL 5 MG/ML IV SOLN
INTRAVENOUS | Status: DC | PRN
Start: 2013-03-10 — End: 2013-03-10
  Administered 2013-03-10: 10 mg via INTRAVENOUS

## 2013-03-10 MED ORDER — METHYLPREDNISOLONE ACETATE 40 MG/ML IJ SUSP
INTRAMUSCULAR | Status: AC
Start: 2013-03-10 — End: ?
  Filled 2013-03-10: qty 1

## 2013-03-10 MED ORDER — FAMOTIDINE 20 MG PO TABS
20.0000 mg | ORAL_TABLET | Freq: Every day | ORAL | Status: DC
Start: 2013-03-11 — End: 2013-03-11
  Administered 2013-03-11: 20 mg via ORAL
  Filled 2013-03-10: qty 1

## 2013-03-10 MED ORDER — EPINEPHRINE HCL 1 MG/ML IJ SOLN
INTRAMUSCULAR | Status: DC | PRN
Start: 2013-03-10 — End: 2013-03-10
  Administered 2013-03-10: .125 mg via SUBCUTANEOUS

## 2013-03-10 MED ORDER — ROPIVACAINE HCL 5 MG/ML IJ SOLN
INTRAMUSCULAR | Status: AC
Start: 2013-03-10 — End: ?
  Filled 2013-03-10: qty 40

## 2013-03-10 MED ORDER — KETOROLAC TROMETHAMINE 30 MG/ML IJ SOLN
INTRAMUSCULAR | Status: DC | PRN
Start: 2013-03-10 — End: 2013-03-10
  Administered 2013-03-10: 30 mg

## 2013-03-10 MED ORDER — METHYLPREDNISOLONE ACETATE 40 MG/ML IJ SUSP
INTRAMUSCULAR | Status: DC | PRN
Start: 2013-03-10 — End: 2013-03-10
  Administered 2013-03-10: 40 mg

## 2013-03-10 MED ORDER — KETOROLAC TROMETHAMINE 30 MG/ML IJ SOLN
INTRAMUSCULAR | Status: AC
Start: 2013-03-10 — End: ?
  Filled 2013-03-10: qty 1

## 2013-03-10 MED ORDER — SODIUM CHLORIDE 0.9 % IV SOLN
INTRAVENOUS | Status: DC
Start: 2013-03-10 — End: 2013-03-11

## 2013-03-10 MED ORDER — OXYCODONE HCL ER 10 MG PO T12A
EXTENDED_RELEASE_TABLET | ORAL | Status: AC
Start: 2013-03-10 — End: ?
  Filled 2013-03-10: qty 1

## 2013-03-10 MED ORDER — MEPERIDINE HCL 25 MG/ML IJ SOLN
12.5000 mg | Freq: Once | INTRAMUSCULAR | Status: DC | PRN
Start: 2013-03-10 — End: 2013-03-10

## 2013-03-10 MED ORDER — PROPOFOL 10 MG/ML IV EMUL
INTRAVENOUS | Status: AC
Start: 2013-03-10 — End: ?
  Filled 2013-03-10: qty 40

## 2013-03-10 MED ORDER — HYDROMORPHONE PCA 0.2 MG/ML 100 ML (OUTSOURCED)
INTRAVENOUS | Status: AC
Start: 2013-03-10 — End: 2013-03-11
  Filled 2013-03-10: qty 100

## 2013-03-10 MED ORDER — GLYCOPYRROLATE 0.2 MG/ML IJ SOLN
INTRAMUSCULAR | Status: DC | PRN
Start: 2013-03-10 — End: 2013-03-10
  Administered 2013-03-10: .8 mg via INTRAVENOUS

## 2013-03-10 MED ORDER — VITAMIN C 500 MG PO TABS
500.0000 mg | ORAL_TABLET | Freq: Every day | ORAL | Status: DC
Start: 2013-03-10 — End: 2013-03-11
  Administered 2013-03-11: 500 mg via ORAL
  Filled 2013-03-10: qty 1

## 2013-03-10 MED ORDER — LIDOCAINE HCL 2 % IJ SOLN
INTRAMUSCULAR | Status: AC
Start: 2013-03-10 — End: ?
  Filled 2013-03-10: qty 20

## 2013-03-10 MED ORDER — LABETALOL HCL 5 MG/ML IV SOLN
INTRAVENOUS | Status: AC
Start: 2013-03-10 — End: ?
  Filled 2013-03-10: qty 20

## 2013-03-10 MED ORDER — TRANEXAMIC ACID 100 MG/ML IV SOLN
1000.0000 mg | Freq: Once | INTRAVENOUS | Status: AC
Start: 2013-03-10 — End: 2013-03-10
  Administered 2013-03-10 (×2): 1000 mg via INTRAVENOUS
  Filled 2013-03-10: qty 10

## 2013-03-10 MED ORDER — ROCURONIUM BROMIDE 50 MG/5ML IV SOLN
INTRAVENOUS | Status: AC
Start: 2013-03-10 — End: ?
  Filled 2013-03-10: qty 5

## 2013-03-10 MED ORDER — OXYCODONE-ACETAMINOPHEN 5-325 MG PO TABS
1.0000 | ORAL_TABLET | Freq: Once | ORAL | Status: DC | PRN
Start: 2013-03-10 — End: 2013-03-10

## 2013-03-10 MED ORDER — ROPIVACAINE HCL 5 MG/ML IJ SOLN
INTRAMUSCULAR | Status: DC | PRN
Start: 2013-03-10 — End: 2013-03-10
  Administered 2013-03-10: 30 mL

## 2013-03-10 MED ORDER — CEFAZOLIN SODIUM-DEXTROSE 2-3 GM-% IV SOLR
2.0000 g | INTRAVENOUS | Status: AC
Start: 2013-03-10 — End: 2013-03-10
  Administered 2013-03-10: 2 g via INTRAVENOUS

## 2013-03-10 MED ORDER — CELECOXIB 100 MG PO CAPS
ORAL_CAPSULE | ORAL | Status: AC
Start: 2013-03-10 — End: ?
  Filled 2013-03-10: qty 4

## 2013-03-10 MED ORDER — GLYCOPYRROLATE 1 MG/5ML IJ SOLN
INTRAMUSCULAR | Status: AC
Start: 2013-03-10 — End: ?
  Filled 2013-03-10: qty 5

## 2013-03-10 MED ORDER — CEFAZOLIN SODIUM 1 G IJ SOLR
1.0000 g | Freq: Three times a day (TID) | INTRAMUSCULAR | Status: AC
Start: 2013-03-10 — End: 2013-03-10
  Administered 2013-03-10 (×2): 1 g via INTRAVENOUS
  Filled 2013-03-10 (×2): qty 1000

## 2013-03-10 MED ORDER — NEOSTIGMINE METHYLSULFATE 1 MG/ML IJ SOLN
INTRAMUSCULAR | Status: DC | PRN
Start: 2013-03-10 — End: 2013-03-10
  Administered 2013-03-10: 5 mg via INTRAVENOUS

## 2013-03-10 MED ORDER — FERROUS SULFATE 324 (65 FE) MG PO TBEC
324.0000 mg | DELAYED_RELEASE_TABLET | Freq: Every morning | ORAL | Status: DC
Start: 2013-03-11 — End: 2013-03-11
  Administered 2013-03-11: 324 mg via ORAL
  Filled 2013-03-10: qty 1

## 2013-03-10 MED ORDER — NEOSTIGMINE METHYLSULFATE 1 MG/ML IJ SOLN
INTRAMUSCULAR | Status: AC
Start: 2013-03-10 — End: ?
  Filled 2013-03-10: qty 10

## 2013-03-10 MED ORDER — FENTANYL CITRATE 0.05 MG/ML IJ SOLN
50.0000 ug | INTRAMUSCULAR | Status: DC | PRN
Start: 2013-03-10 — End: 2013-03-10

## 2013-03-10 MED ORDER — LACTATED RINGERS IV SOLN
INTRAVENOUS | Status: DC | PRN
Start: 2013-03-10 — End: 2013-03-10

## 2013-03-10 MED ORDER — PROPOFOL 10 MG/ML IV EMUL
INTRAVENOUS | Status: DC | PRN
Start: 2013-03-10 — End: 2013-03-10
  Administered 2013-03-10: 200 mg via INTRAVENOUS

## 2013-03-10 MED ORDER — SUCCINYLCHOLINE CHLORIDE 20 MG/ML IJ SOLN
INTRAMUSCULAR | Status: DC | PRN
Start: 2013-03-10 — End: 2013-03-10
  Administered 2013-03-10: 140 mg via INTRAVENOUS

## 2013-03-10 MED ORDER — DIAZEPAM 5 MG PO TABS
5.0000 mg | ORAL_TABLET | Freq: Three times a day (TID) | ORAL | Status: DC | PRN
Start: 2013-03-10 — End: 2013-03-11

## 2013-03-10 MED ORDER — CEFAZOLIN SODIUM-DEXTROSE 2-3 GM-% IV SOLR
INTRAVENOUS | Status: AC
Start: 2013-03-10 — End: ?
  Filled 2013-03-10: qty 50

## 2013-03-10 MED ORDER — ONDANSETRON HCL 4 MG/2ML IJ SOLN
INTRAMUSCULAR | Status: AC
Start: 2013-03-10 — End: ?
  Filled 2013-03-10: qty 2

## 2013-03-10 MED ORDER — FENTANYL CITRATE 0.05 MG/ML IJ SOLN
INTRAMUSCULAR | Status: DC | PRN
Start: 2013-03-10 — End: 2013-03-10
  Administered 2013-03-10 (×2): 50 ug via INTRAVENOUS
  Administered 2013-03-10: 100 ug via INTRAVENOUS
  Administered 2013-03-10: 50 ug via INTRAVENOUS

## 2013-03-10 MED ORDER — ROCURONIUM BROMIDE 50 MG/5ML IV SOLN
INTRAVENOUS | Status: DC | PRN
Start: 2013-03-10 — End: 2013-03-10
  Administered 2013-03-10 (×3): 20 mg via INTRAVENOUS
  Administered 2013-03-10: 30 mg via INTRAVENOUS
  Administered 2013-03-10: 10 mg via INTRAVENOUS

## 2013-03-10 MED ORDER — PROMETHAZINE HCL 25 MG/ML IJ SOLN
6.2500 mg | Freq: Once | INTRAMUSCULAR | Status: DC | PRN
Start: 2013-03-10 — End: 2013-03-10

## 2013-03-10 MED ORDER — DEXAMETHASONE SODIUM PHOSPHATE 4 MG/ML IJ SOLN
INTRAMUSCULAR | Status: AC
Start: 2013-03-10 — End: ?
  Filled 2013-03-10: qty 2

## 2013-03-10 MED ORDER — SODIUM CHLORIDE 0.9 % IR SOLN
Status: DC | PRN
Start: 2013-03-10 — End: 2013-03-10
  Administered 2013-03-10: 1000 mL

## 2013-03-10 MED ORDER — MIDAZOLAM HCL 2 MG/2ML IJ SOLN
INTRAMUSCULAR | Status: AC
Start: 2013-03-10 — End: ?
  Filled 2013-03-10: qty 2

## 2013-03-10 MED ORDER — MIDAZOLAM HCL 2 MG/2ML IJ SOLN
INTRAMUSCULAR | Status: DC | PRN
Start: 2013-03-10 — End: 2013-03-10
  Administered 2013-03-10: 2 mg via INTRAVENOUS

## 2013-03-10 MED ORDER — ACETAMINOPHEN 500 MG PO TABS
ORAL_TABLET | ORAL | Status: AC
Start: 2013-03-10 — End: ?
  Filled 2013-03-10: qty 2

## 2013-03-10 MED ORDER — HYDROMORPHONE HCL 2 MG/ML IJ SOLN
INTRAMUSCULAR | Status: AC
Start: 2013-03-10 — End: ?
  Filled 2013-03-10: qty 1

## 2013-03-10 MED ORDER — SALINE BACTERIOSTATIC 0.9 % IJ SOLN
INTRAMUSCULAR | Status: DC | PRN
Start: 2013-03-10 — End: 2013-03-10
  Administered 2013-03-10: 30 mL

## 2013-03-10 MED ORDER — ONDANSETRON HCL 4 MG/2ML IJ SOLN
4.0000 mg | Freq: Once | INTRAMUSCULAR | Status: DC | PRN
Start: 2013-03-10 — End: 2013-03-10

## 2013-03-10 MED ORDER — ROSUVASTATIN CALCIUM 5 MG PO TABS
10.0000 mg | ORAL_TABLET | Freq: Every day | ORAL | Status: DC
Start: 2013-03-10 — End: 2013-03-11
  Administered 2013-03-11: 10 mg via ORAL
  Filled 2013-03-10: qty 2

## 2013-03-10 MED ORDER — LIDOCAINE HCL 2 % IJ SOLN
INTRAMUSCULAR | Status: DC | PRN
Start: 2013-03-10 — End: 2013-03-10
  Administered 2013-03-10: 50 mg

## 2013-03-10 MED ORDER — HYDROMORPHONE HCL PF 1 MG/ML IJ SOLN
0.5000 mg | INTRAMUSCULAR | Status: DC | PRN
Start: 2013-03-10 — End: 2013-03-10

## 2013-03-10 SURGICAL SUPPLY — 80 items
APPLCATOR CHLORAPREP 26ML (Prep) ×2 IMPLANT
BAG EQP ISWITCH CLN DRY (Sleeve) ×1
BAG EQUIPMENT CLEAN DRY ISWITCH (Sleeve) ×1 IMPLANT
BLADE DEPUY SAW PFC SIGMA 1/2 (Blade) ×2 IMPLANT
CLOTH BEACON TIMEOUT ORANGE (Other) ×2 IMPLANT
CNSR MEDIVAC CRD LINER W LID (Suction) ×2 IMPLANT
DRAPE 3/4 SHEET FANFLD 52X76IN (Drape) ×4 IMPLANT
DRAPE 74X41IN UNIVERSAL POLY XRAY C ARM CLOSURE STRAP (Drape) ×1 IMPLANT
DRAPE EQP VLCR POLY UNV STRDRP 74X41IN (Drape) ×2 IMPLANT
DRAPE SRG PLS U STRDRP 51X47IN LF STRL (Drape) ×2
DRAPE STRDRP IOBN SRG 125X83IN PLS 19IN (Drape) ×1
DRAPE SURGICAL 2 INCISE FILM POUCH (Drape) ×1 IMPLANT
DRAPE SURGICAL 2 INCISE FILM POUCH ISOLATION ADHESIVE STRIP L125 IN X (Drape) ×1 IMPLANT
DRAPE SURGICAL ADHESIVE L51 IN X W47 IN (Drape) ×2 IMPLANT
DRAPE SURGICAL ADHESIVE L51 IN X W47 IN STERI-DRAPE CLEAR (Drape) ×2 IMPLANT
DRAPE TABLE 1 PIECE COVER BARRIER (Drape) ×1 IMPLANT
DRAPE TABLE 1 PIECE COVER BARRIER PLASTIC PEDIGO PRODUCTS, INC. CLEAR (Drape) ×1 IMPLANT
DRAPE TBL PLS STRL 1 PC CVR BRR DISP CLR (Drape) ×1
DRESSING PETRO 3% BI 3BRM GZE XR 8X1IN (Dressing) ×1
DRESSING PETROLATUM XEROFORM L8 IN X W1 (Dressing) ×1 IMPLANT
DRESSING PETROLATUM XEROFORM L8 IN X W1 IN 3% BISMUTH TRIBROMOPHENATE (Dressing) ×1 IMPLANT
DRESSING TRANSPARENT L12 IN X W8 IN (Dressing) ×2 IMPLANT
DRESSING TRANSPARENT L12 IN X W8 IN POLYURETHANE ADHESIVE (Dressing) ×2 IMPLANT
DRESSING TRNS PU TGDRM 12X8IN LF STRL (Dressing) ×2
ELIMINATOR HL MRTHN PNCL STRL APX HIP (Component) ×1 IMPLANT
ELIMINATOR HOLE APEX PINNACLE MARATHON (Component) ×1 IMPLANT
ELIMINATOR HOLE APEX PINNACLE MARATHON HIP (Component) ×1 IMPLANT
GAUZE SPONGE CURITY 12PLY 4X8 (Dressing) ×2 IMPLANT
GLOVE BIOGEL SURG PF SZ 8.5 (Glove) ×12 IMPLANT
GLOVE SURG BIOGEL INDIC SZ 6.5 (Glove) ×2 IMPLANT
GLOVE SURG BIOGEL INDIC SZ 8.5 (Glove) ×2 IMPLANT
GLOVE SURG BIOGEL SZ6.5 (Glove) ×4 IMPLANT
HEAD FEM BLX D +5MM 12/14 TPR ART/EZE 36 (Head) ×1 IMPLANT
HEAD FEMORAL OD36 MM +5 MM  12/14  TAPER HIP BIOLOX DELTA ARTICUL/EZE (Head) ×1 IMPLANT
HEAD FEMORAL OD36 MM +5 MM 12/14 TAPER (Head) ×1 IMPLANT
HOLDER CATH VLCR UNV CATH-MATE LF ADJ (Procedure Accessories) ×1
HOLDER CATHETER UNIVERSAL ADJUSTABLE (Procedure Accessories) ×1 IMPLANT
HOOD SURGEON BLUE DISP (Personal Protection) ×8 IMPLANT
INACTIVE USE LAWSON 112416 (Drape) ×2 IMPLANT
KIT DRAINAGE L12.5 IN ROUND 3 SPRING (Drain) ×1 IMPLANT
KIT DRAINAGE L12.5 IN ROUND 3 SPRING EVACUATOR Y CONNECTOR TUBE HOLE (Drain) ×1 IMPLANT
KIT DRN PVC 400CC RND 1/8IN 12.5IN LF (Drain) ×1
KIT INFECTION CONTROL CUSTOM (Kits) ×2 IMPLANT
KIT INFECTION CONTROL CUSTOM IFOH03 (Kits) ×1 IMPLANT
KIT POSITIONING HANA PROFX (Kits) ×1 IMPLANT
KIT POSITIONING L9 IN PERINEAL POST (Kits) ×1 IMPLANT
KIT PSTN HANA HANA SSXT PROFX 9IN PERI (Kits) ×1
LINER ACETABULAR OD54 MM ID36 MM NEUTRAL (Liner) ×1 IMPLANT
LINER ACETABULAR OD54 MM ID36 MM NEUTRAL ALTRX PINNACLE HIP (Liner) ×1 IMPLANT
LINER ACTB ALTRX NTRL PNCL 54MM 36MM LF (Liner) ×1 IMPLANT
NEEDLE 18GA 1-1/2IN (Needles) ×2 IMPLANT
NEEDLE NOKOR FILTER 19GX1.5IN (Needles) ×2 IMPLANT
PACK ANTERIOR HIP (Pack) ×2 IMPLANT
POUCH INST 18X30CM (Drape) ×2 IMPLANT
SHELL ACETABULAR OD54 MM HIP GRIPTION (Shell) ×1 IMPLANT
SHELL ACETABULAR OD54 MM HIP GRIPTION PINNACLE 100 SERIES (Shell) ×1 IMPLANT
SHELL ACTB GRIPTION PNCL 54MM LF STRL (Shell) ×1 IMPLANT
STEM FMRL 12/14 TAP SZ 6 107M (Stem) ×2 IMPLANT
STRIP SKIN CLOSURE L4 IN X W1/2 IN (Dressing) ×2 IMPLANT
STRIP SKIN CLOSURE L4 IN X W1/2 IN REINFORCE STERI-STRIP POLYESTER (Dressing) ×2 IMPLANT
STRIP SKNCLS PLSTR STRSTRP 4X.5IN LF (Dressing) ×2
SUT V-LOC 180 3-0 18IN P-14 S (Suture) ×2 IMPLANT
SUTURE ABS 1 CT1 VCL 36IN BRD COAT VIOL (Suture) ×1
SUTURE ABS 2-0 CT1 VCL 27IN BRD COAT UD (Suture) ×1
SUTURE COATED VICRYL 1 CT-1 L36 IN BRAID (Suture) ×1 IMPLANT
SUTURE COATED VICRYL 1 CT-1 L36 IN BRAID COATED VIOLET ABSORBABLE (Suture) ×1 IMPLANT
SUTURE COATED VICRYL 2-0 CT-1 L27 IN (Suture) ×1 IMPLANT
SUTURE COATED VICRYL 2-0 CT-1 L27 IN BRAID COATED UNDYED ABSORBABLE (Suture) ×1 IMPLANT
SUTURE ETHIBOND EXCEL 1 CT-1 L30 IN (Suture) ×2 IMPLANT
SUTURE ETHIBOND EXCEL 1 CT-1 L30 IN BRAID NONABSORBABLE (Suture) ×2 IMPLANT
SUTURE NABSB 1 CT1 EBND EXC 30IN BRD (Suture) ×2
SYRINGE 50 ML GRADUATE NONPYROGENIC DEHP (Syringes, Needles) ×1 IMPLANT
SYRINGE 50 ML GRADUATE NONPYROGENIC DEHP FREE PVC FREE LOK MEDICAL (Syringes, Needles) ×1 IMPLANT
SYRINGE LEUR LOK TIP 30 ML (Syringes, Needles) ×2 IMPLANT
SYRINGE MED 50ML LL LF STRL GRAD N-PYRG (Syringes, Needles) ×1
TOWEL L26 IN X W17 IN COTTON PREWASH DELINT BLUE ACTISORB DELUXE (Procedure Accessories) ×1 IMPLANT
TOWEL SRG CTTN 26X17IN LF STRL PREWASH (Procedure Accessories) ×2 IMPLANT
TRAY CATH FOLEY 16F (Catheter Micellaneous) ×1
TRAY CATH FOLEY 16F (Catheter Miscellaneous) ×1 IMPLANT
TUBE SUCTION KAM VAC CVD 10IN (Suction) ×2 IMPLANT

## 2013-03-10 NOTE — Anesthesia Preprocedure Evaluation (Signed)
Anesthesia Evaluation    AIRWAY    Mallampati: II    TM distance: >3 FB  Neck ROM: full  Mouth Opening:full   CARDIOVASCULAR    cardiovascular exam normal       DENTAL    No notable dental hx     PULMONARY    pulmonary exam normal     OTHER FINDINGS                      Anesthesia Plan    ASA 2     general               (Risks discussed including but not limited to:  neurological complications such as stroke,   cardiovascular complications such as heart attack,   pulmonary complications such as asthmatic attack,   intra-operative awareness, death, dental trauma, and allergic reaction.     Questions answered.     Pt understands and wishes to proceed.    Briant Cedar, MD  03/10/2013 )      intravenous induction   Detailed anesthesia plan: general LMA        Post op pain management: per surgeon    informed consent obtained    ECG reviewed  pertinent labs reviewed

## 2013-03-10 NOTE — H&P (Signed)
PRE-OP HISTORY AND PHYSICAL EXAM    Date Time: 03/10/2013 7:06 AM  Patient Name: Jerry Hickman  Attending Physician: Cynda Familia, MD    Assessment:   Left hip DJD    Plan:   Left anterior THA    History of Present Illness:   Jerry Hickman is a 57 y.o. male who presents to the hospital for left THA due to history of ongoing left hip pain secondary to OA, failed conservative management.     Past Medical History:     Past Medical History   Diagnosis Date   . Depression screening negative 12-01-12     Neg   . Chronic hip pain    . Abnormal vision      reading   . H/O splenomegaly      Denies this condition   . Hypertensive disorder      on med   . Hyperlipidemia      on med   . Headache following lumbar puncture 2004     s/p spinal tap .... blood patch needed   . GERD (gastroesophageal reflux disease)      on med   . Arthritis      L hip   . Difficulty in walking(719.7)      limp .Marland KitchenMarland Kitchenpending surgery       Past Surgical History:     Past Surgical History   Procedure Date   . Replacement total knee 01-18-11     right   . Total hip arthroplasty 11-2004     Right   . Colonoscopy 2014   . Joint replacement      hip and knee       Family History:     Family History   Problem Relation Age of Onset   . Cancer Mother      ovarian   . Heart attack Father    . Heart disease Father    . Hyperlipidemia Father    . Hypertension Father        Social History:     History     Social History   . Marital Status: Married     Spouse Name: N/A     Number of Children: N/A   . Years of Education: N/A     Social History Main Topics   . Smoking status: Never Smoker    . Smokeless tobacco: Never Used   . Alcohol Use: 2.0 oz/week     3 Glasses of wine per week   . Drug Use: No   . Sexually Active: Not on file     Other Topics Concern   . Not on file     Social History Narrative   . No narrative on file       Allergies:   No Known Allergies    Medications:     Prescriptions prior to admission   Medication Sig   . amLODIPine (NORVASC) 10 MG  tablet Take 1 tablet (10 mg total) by mouth daily.   . pantoprazole (PROTONIX) 20 MG tablet Take 1 tablet (20 mg total) by mouth daily.   . rosuvastatin (CRESTOR) 10 MG tablet Take 1 tablet (10 mg total) by mouth daily.   . traMADol (ULTRAM) 50 MG tablet Take 50 mg by mouth as needed.    Marland Kitchen atorvastatin (LIPITOR) 10 MG tablet Take 1 tablet (10 mg total) by mouth daily.       Review of Systems:   No chest pain, shortness  of breath, dizziness, headache.    Physical Exam:   There were no vitals filed for this visit.    General appearance - alert, well appearing, and in no distress  Heart - RRR  Lungs - CTAB  Musculoskeletal - abnormal exam of left hip.  Decreased ROM, pain with ROM, n/v intact.   Skin - normal coloration and turgor, no rashes, no suspicious skin lesions noted    Labs:     Results     ** No Results found for the last 24 hours. **          Rads:   Radiological Procedure reviewed.    Signed by: Posey Pronto

## 2013-03-10 NOTE — Op Note (Signed)
Procedure Date: 03/10/2013     Patient Type: I     SURGEON: Cynda Familia MD  ASSISTANT:  Posey Pronto PA     SECOND ASSISTANT:   Mr. Delsa Sale, CSA     PREOPERATIVE DIAGNOSIS:  Degenerative arthritis, left hip.     POSTOPERATIVE DIAGNOSIS:  Degenerative arthritis, left hip.     TITLE OF PROCEDURE:  Left total hip replacement.     ANESTHETIC:  General endotracheal.     BLOOD LOSS:  400 mL.     SUMMARY OF IMPLANTS:  We used the DePuy Tri-Lock stem size 6 standard offset, a +5 x 36 ceramic  ball.  On the femoral side, we used the Gription Pinnacle 100 series  acetabular shell size 54 mm with a neutral polyethylene liner and a hole  eliminator.     FINDINGS:  The patient had advanced arthritis of the hip.  He had good bone quality.     DESCRIPTION OF PROCEDURE:  After properly identifying the patient, he was given 2 grams of Ancef  intravenously as well as a gram of TXA.  He was then brought to the  operating room, placed in supine position on the gurney.  There he was  anesthetized with an excellent general endotracheal anesthetic.  A Foley  catheter was positioned by the nursing staff, which was removed at the  conclusion of the case.  The boots were applied to his feet.  He was  transferred to the [facility] table in a supine position.  Once there, the left  lower abdomen, hip, thigh, and knee were prepped and draped in the usual  sterile fashion.  A 4-inch incision was started just lateral and distal to  the anterior-superior iliac spine and extending distally and obliquely down  the anterior thigh.  This was deepened through subcutaneous tissue.  Points  of bleeding were electrocoagulated.  We then incised the fascia of the  tensor and reflected the muscle fibers laterally.  We deepened the exposure  by reflecting the rectus femoris off the anterior capsule.  Cobra  retractors were placed on the femoral neck medially and laterally and their  position was checked with the C-arm.  We then performed the  arthrotomy  starting at the lesser trochanter across the intertrochanteric line and  then up along the lateral neck.  Stay sutures were placed in the medial and  lateral limbs of the capsule.  The Cobra retractors were placed directly on  the femoral neck.  The position was checked again with C-arm imaging and  their position was used to guide the osteotomy of the femoral neck.  First,  a corkscrew was placed in the femoral head, the osteotomy was completed,  and the head was removed.  Retractors were placed about the acetabulum.   The labrum was debrided with the ligament of Teres also debrided.  We  reamed to a 53-mm diameter, trialed a 54, and noted proper fit.  We removed  the trial.  We irrigated and dried the bony surfaces.  As the component was  opened, we then inserted the acetabular shell in 40 degrees of inclination  and 15 to 20 degrees of anteversion and was checked with C-arm.  Hole  eliminator was inserted, followed by the locking in place the polyethylene  liner.  Turning our attention to the femur, we reflected the capsule off  the lateral neck around the posterolateral corner.  We externally rotated  the foot to 100 degrees with  proximal femur at 90, put the sterile hook  under the femur and elevated into the wound as the foot was dropped to the  floor.  With retractors placed medially, we prepared the femur for the stem  first using the box cutting osteotome, canal finder, and then broaching,  settling on a size 6.  We planed down the calcar, did a trial reduction  with a +5 trial, and comparison x-ray showed not only proper restoration of  length but a nice fit with stem.  The trial component was then dislocated  and removed.  We opened and inserted the permanent implant, seating it  fully, applying the ceramic ball to the [facility] taper of the stem, reducing the  hip.  A final image was taken of the implant.  We irrigated with 2 liters  of warm saline.  We tied the capsular sutures to each other.   We injected  the intraoperative cocktail of Duramorph, ropivacaine, Toradol,  epinephrine, Depo-Medrol, and all mixed in normal saline throughout the  wound. The wound was closed in layers after drain was placed deep to the  fascial closure.  Sterile dressings were applied.  Drain was hooked to  closed suction.  At this point then, the patient was transferred to the  hospital bed where he was awakened, extubated, and transferred to the  recovery room in stable, satisfactory, and awake condition.  Final sponge  and needle counts were reported as correct.     NEED FOR ASSISTANCE:  Ms. Lonna Cobb assistance was essential in primary providing proper  positioning and exposure throughout the case, helping with the preparation  of the bone and insertion of implant.  She was present for wound  closure and dressing application.           D:  03/10/2013 11:14 AM by Dr. Cynda Familia, MD [phone])  T:  03/10/2013 17:00 PM by       Everlean Cherry: [id number]) (Doc ID: [id number])

## 2013-03-10 NOTE — Progress Notes (Signed)
Pt arrived from pacu. Pt alert and oriented x4. Vss. Incision clean dry intact. Pt denies pain at this time. Tolerating sips of clear liq. Family at bedside. Pt instructed to call RN or tech before getting oob by self. belongings and call bell with in reach. Will continue to check on frequently.

## 2013-03-10 NOTE — Plan of Care (Signed)
Problem: Safety  Goal: Patient will be free from injury during hospitalization  Intervention: Assess patient's risk for falls and implement fall prevention plan of care per policy  Pt is A&OX3. Safety instructions given on using the call light and proper way to get oob post hip surgery. Pt verbalized understanding.

## 2013-03-10 NOTE — PT Eval Note (Signed)
[facility]  Physical Therapy Evaluation    Patient: Jerry Hickman MRN: [id number]   Unit: 5NEW ORTHOPEDICS    Bed: N562/Z308-65    Assessment:   Assessment: Decreased LE ROM;Decreased LE strength;Decreased functional mobility;Gait impairment, resulting in difficulty with ADLs.  Prognosis: Good;With continued PT status post acute discharge    Interdisciplinary Communication:   Communicated with RN re gait ability    Plan:   Recommendation  Discharge Recommendation: Home with outpatient PT  DME Recommended for Discharge:  (none)  PT - Next Visit Recommendation: 03/11/13  PT Frequency: 6-7x/wk  Patient Goal:  (back to work)   Risks/Benefits/POC Discussed with Pt/Family: With patient/family   Treatment/Interventions: Exercise;Gait training;Functional transfer training;Bed mobility  Progress: Improving as expected  Goals  Goal Formulation: With patient/family  Time for Goal Acheivement: 3 visits  Goals: Select goal  Pt Will Go Supine To Sit: Independently  Pt Will Transfer Bed/Chair: Independently (or c min A for LLE into bed, car)  Pt Will Ambulate: 151-200 feet;with 2 crutches;Independently  Pt Will Go Up / Down Stairs: 1 flight;Independently  Pt Will Perform Home Exer Program: Independently;With verbal prompts required/provided    Education:   Educated patient/caregiver to role of physical therapy, plan of care, transfer training techniques, gait training techniques ant hip precautions, home safety, next appropriate level of care, exercises for lower extremity including THR protocol.  Provided handout on HEP    Patient/caregiver demonstrated good understanding.  Will benefit from further training ofgait c crutches.  Discussed goals of therapy with patient/caregiver, in agreement with plan.    Evaluation:   Consult received for Jerry Hickman for PT evaluation and treatment.  Chart reviewed.  Patient's medical condition is appropriate for Physical Therapy intervention at this time.     Medical  Diagnosis: Localized osteoarthrosis not specified whether primary or secondary, pelvic region and thigh [715.35] (LEFT HIP DJD)  Localized osteoarthrosis not specified whether primary or secondary, pelvic region and thigh    Therapy Diagnosis: difficulty walking    Precautions  Weight Bearing Status: LLE WBAT  Total Hip Replacement: anterior precautions    History of Present Illness: Jerry Hickman is a 57 y.o. male admitted on 03/10/2013 with L anterior THR    Patient Active Problem List   Diagnosis   . History of sarcoidosis   . Essential hypertension, benign   . Mixed hyperlipidemia   . ED (erectile dysfunction)   . GERD (gastroesophageal reflux disease)   . 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 Depression screening negative 12-01-12     Neg   . Chronic hip pain    . Abnormal vision      reading   . H/O splenomegaly      Denies this condition   . Hypertensive disorder      on med   . Hyperlipidemia      on med   . Headache following lumbar puncture 2004     s/p spinal tap .... blood patch needed   . GERD (gastroesophageal reflux disease)      on med   . Arthritis      L hip   . Difficulty in walking(719.7)      limp .Marland KitchenMarland Kitchenpending surgery     Past Surgical History   Procedure Date   . Replacement total knee 01-18-11     right   .  Total hip arthroplasty 11-2004     Right   . Colonoscopy 2014   . Joint replacement      hip and knee         Prior Level of Function  Prior level of function: Ambulates / Performs ADL's independently  Baseline Activity Level: Community ambulation  DME Currently at Home: Crutches  Home Living Arrangements  Living Arrangements: Spouse/significant other  Type of Home: House  Home Layout: Multi-level  DME Currently at Home: Crutches    Subjective: Patient is agreeable to participation in the therapy session.   Pain Assessment  Pain Assessment: Numeric Scale (0-10)  Pain Score: 4-moderate  pain  Pain Location: Hip  Pain Intervention(s): Cold applied;Repositioned (reviewed use of pain meds)          Objective:  Patient is in bed with dressings and hemovac in place. Wife present    Observation of patient/vitals  Inspection/Posture  Inspection/Posture:  (WD WN)  Cognition  Arousal/Alertness: Appropriate responses to stimuli  Following Commands: Follows all commands and directions without difficulty     O2 sats 93% p gait on room air  Musculoskeletal Examination  Gross ROM  Right Upper Extremity ROM: within functional limits  Left Upper Extremity ROM: within functional limits  Right Lower Extremity ROM: within functional limits (sp THR TKR full AROM)  Left Lower Extremity ROM: within functional limits (hip adb to 40 flex to 90 s difficulty)                                  Sensation: decreased light touch L lateral thigh    Gross Strength  Right Upper Extremity Strength: within functional limits  Left Upper Extremity Strength: within functional limits  Right Lower Extremity Strength: within functional limits  Left Lower Extremity Strength:  (good isometrics, I abd, I heel slide)                Functional Mobility  Supine to Sit: Stand by assistance  Sit to Stand: Stand by assistance  Stand to Sit: Stand by assistance       Balance  Balance: within functional limits    Locomotion  Ambulation: stand by assistance;with front-wheeled walker  Ambulation Distance (Feet): 120 Feet  Pattern:  (good weight shift to L)    Participation and Endurance  Participation Effort: good  Endurance: Tolerates 30+ min exercise without fatigue (no hypotension c OOB)     Time Calculation  PT Received On: 03/10/13  Start Time: 1400  Stop Time: 1430  Time Calculation (min): 30 min  Tiburcio Pea, PT

## 2013-03-10 NOTE — Transfer of Care (Signed)
Anesthesia Transfer of Care Note    Patient: Jerry Hickman    Procedures performed: Procedure(s) with comments:  ARTHROPLASTY, HIP, TOTAL, ANTERIOR APPROACH, C ARM - LEFT HIP THA ANT      Anesthesia type: General ETT    Patient location:Phase I PACU    Last vitals:   Filed Vitals:    03/10/13 0722   BP: 172/102   Pulse: 69   Temp: 98.6 F (37 C)   Resp: 18   SpO2: 97%       Post pain: Patient not complaining of pain, continue current therapy      Mental Status:sedated    Respiratory Function: tolerating face mask    Cardiovascular: stable    Nausea/Vomiting: patient not complaining of nausea or vomiting    Hydration Status: adequate    Post assessment: no apparent anesthetic complications

## 2013-03-10 NOTE — Brief Op Note (Signed)
BRIEF OP NOTE    Date Time: 03/10/2013 10:11 AM    Patient Name:   Jerry Hickman    Date of Operation:   03/10/2013    Providers Performing:   Surgeon(s):  Cynda Familia, MD  Posey Pronto, PA  Flossie Dibble, CSA    Assistant (s):    Operative Procedure:   Procedure(s):  ARTHROPLASTY, HIP, TOTAL, ANTERIOR APPROACH, C ARM    Preoperative Diagnosis:   Pre-Op Diagnosis Codes:     * Localized osteoarthrosis not specified whether primary or secondary, pelvic region and thigh [715.35]    Postoperative Diagnosis:   Localized osteoarthrosis not specified whether primary or secondary, pelvic region and thigh [715.35][    Anesthesia:   General    Estimated Blood Loss:    400cc    Implants:     Implant Name Type Inv. Item Serial No. Manufacturer Lot No. LRB No. Used Action   LINER PINN ACETAB [id number] - [id number] Liner LINER PINN ACETAB 36X54MM   [id number] Left 1 Implanted   HOLE ELIMINATOR STOP - [id number] Component HOLE ELIMINATOR STOP   [id number] Left 1 Implanted   SHELL PINN 100 GRIPTON - [id number] Shell SHELL PINN 100 GRIPTON   [id number] Left 1 Implanted   STEM FMRL 12/14 TAP SZ 6 107M - [id number] Stem STEM FMRL 12/14 TAP SZ 6 107M   [id number] Left 1 Implanted   HEAD BIOLX CER FMRL +5 - [id number] Head HEAD BIOLX CER FMRL +5     [id number] Left 1 Implanted       Drains:   Drains: Yes, Drain #1: Hemovac 1/8 in    Specimens:        SPECIMENS (last 24 hours)      Pathology Specimens     Row Name 03/10/13 0900             Specimen Information    Specimen Testing Required Gross Only     Specimen ID  A     Specimen Description Left femoral head and reamings         Findings:   DJD left hip    Complications:   None      Signed by: Cynda Familia, MD                                                                              [place] [facility]

## 2013-03-10 NOTE — Anesthesia Postprocedure Evaluation (Signed)
Anesthesia Post Evaluation    Patient: Jerry Hickman    Procedures performed: Procedure(s) with comments:  ARTHROPLASTY, HIP, TOTAL, ANTERIOR APPROACH, C ARM - LEFT HIP THA ANT      Anesthesia type: General ETT    Patient location:Phase I PACU    Last vitals:   Filed Vitals:    03/10/13 1050   BP: 156/83   Pulse: 64   Temp:    Resp: 16   SpO2: 100%       Post pain: Continue adjustment of pain medication; with dilaudid     Mental Status:awake    Respiratory Function: tolerating room air    Cardiovascular: stable    Nausea/Vomiting: patient not complaining of nausea or vomiting    Hydration Status: adequate    Post assessment: no apparent anesthetic complications

## 2013-03-11 ENCOUNTER — Other Ambulatory Visit: Payer: Self-pay

## 2013-03-11 ENCOUNTER — Encounter: Payer: Self-pay | Admitting: Orthopaedic Surgery

## 2013-03-11 LAB — GLUCOSE WHOLE BLOOD - POCT: Whole Blood Glucose POCT: 121 mg/dL — ABNORMAL HIGH (ref 70–100)

## 2013-03-11 LAB — CBC
Hematocrit: 36 % — ABNORMAL LOW (ref 42.0–52.0)
Hgb: 12.4 g/dL — ABNORMAL LOW (ref 13.0–17.0)
MCH: 31.4 pg (ref 28.0–32.0)
MCHC: 34.4 g/dL (ref 32.0–36.0)
MCV: 91.1 fL (ref 80.0–100.0)
MPV: 10.2 fL (ref 9.4–12.3)
Nucleated RBC: 0 (ref 0–1)
Platelets: 165 (ref 140–400)
RBC: 3.95 — ABNORMAL LOW (ref 4.70–6.00)
RDW: 12 % (ref 12–15)
WBC: 11.82 — ABNORMAL HIGH (ref 3.50–10.80)

## 2013-03-11 LAB — BASIC METABOLIC PANEL
Anion Gap: 8 (ref 5.0–15.0)
BUN: 14 mg/dL (ref 9–21)
CO2: 23 mEq/L (ref 22–29)
Calcium: 8.5 mg/dL (ref 8.5–10.5)
Chloride: 107 mEq/L (ref 98–107)
Creatinine: 1.1 mg/dL (ref 0.7–1.3)
Glucose: 195 mg/dL — ABNORMAL HIGH (ref 70–100)
Potassium: 4.4 mEq/L (ref 3.5–5.1)
Sodium: 138 mEq/L (ref 136–145)

## 2013-03-11 LAB — GFR: EGFR: >60

## 2013-03-11 MED ORDER — OXYCODONE-ACETAMINOPHEN 5-325 MG PO TABS
ORAL_TABLET | ORAL | 0 refills | Status: DC
Start: 2013-03-11 — End: 2014-11-08
  Filled 2013-03-11: qty 100, 10d supply, fill #0

## 2013-03-11 MED ORDER — RIVAROXABAN 10 MG PO TABS
10.0000 mg | ORAL_TABLET | Freq: Every morning | ORAL | 0 refills | Status: DC
Start: 2013-03-11 — End: 2013-05-14
  Filled 2013-03-11: qty 30, 30d supply, fill #0

## 2013-03-11 NOTE — Plan of Care (Signed)
Pt provided [place] instr verbally and written. Pt verbalized understanding. Pt dced home with spouse. Rx info provided. Dressing supplies provided.

## 2013-03-11 NOTE — Progress Notes (Signed)
Ortho Note  POD # : 1  Subjective:     Patient ID: Jerry Hickman is a 57 y.o. male.    Chief Complaint:  S/p Left THA.  Patient doing well, pain well managed on percocet. Ambulated over 100 feet with PT.  Requests d/c home today.  No complaints.        Objective:     Filed Vitals:    03/11/13 0816   BP: 132/72   Pulse: 73   Temp: 97.5 F (36.4 C)   Resp: 16   SpO2: 95%       Gen: Alert and oriented, cooperative  Extremities:  Dressing changed, incision c/d/i, 2+ pedal pulses, full ROM with foot pumping and toe movement, 1+ edema, No calf tenderness  Neurological:  Sensation intact to light touch    Results     Procedure Component Value Units Date/Time    Basic metabolic panel [[id number]]  (Abnormal) Collected:03/11/13 0448    Specimen Information:Blood Updated:03/11/13 0634     Glucose 195 (H) mg/dL      BUN 14 mg/dL      Creatinine 1.1 mg/dL      CALCIUM 8.5 mg/dL      Sodium 657 mEq/L      Potassium 4.4 mEq/L      Chloride 107 mEq/L      CO2 23 mEq/L      Anion Gap 8.0     Narrative:    QAMLAB X3  QAMLAB x2    GFR [[id number]] Collected:03/11/13 0448     EGFR >60.0 Updated:03/11/13 0634    Narrative:    Blenda Peals x2    CBC [[id number]]  (Abnormal) Collected:03/11/13 0448    Specimen Information:Blood / Blood Updated:03/11/13 0517     WBC 11.82 (H)      RBC 3.95 (L)      Hgb 12.4 (L) g/dL      Hematocrit 02.7 (L) %      MCV 91.1 fL      MCH 31.4 pg      MCHC 34.4 g/dL      RDW 12 %      Platelets 165      MPV 10.2 fL      Nucleated RBC 0     Narrative:    QAMLAB X3  QAMLAB x2          Assessment:     Doing well postoperatively.      Plan:     Continue pain management, DVT prophylaxis, and therapy.  [place] home today with home PT  Fu with Dr. Rayvon Char in 2 weeks  Percocet for pain management  Xarelto x 30 days for DVT prophylaxis.        Posey Pronto

## 2013-03-11 NOTE — OT Eval Note (Signed)
[facility]   Occupational Therapy Evaluation     Patient: Jerry Hickman      MRN#: [id number]   Unit: 5NEW ORTHOPEDICS  Bed: V409/W119-14  Time of treatment:Start Time: 0845 Stop Time: 0901 Time Calculation (min): 16 min    Consult received for Jerry Hickman for OT Evaluation and Treatment.  Patient's medical condition is appropriate for Occupational therapy intervention at this time.    Assessment:   Assessment:  (no fucntional deficts at this time).   Prognosis: Good;With family    Interdisciplinary Communication   Patient is in bedside chair and call bell/bed alarm set within reach. Communicated with RN verbally regarding mobility.    Plan:    Discharge Recommendation: Home with supervision (for shower transfers)       Goals:   Patient Goal: to be d/c today           Education:   Educated patient/caregiver to role of occupational therapy, plan of care, home safety, next appropriate level of care, safety with mobility and ADLs, hip precautions, weight bearing precautions, home safety.   Demonstrated good understanding.   Also discussed goals of therapy and are in agreement with plan.      Evaluation:   Precautions  Weight Bearing Status: LLE WBAT  Total Hip Replacement: anterior precautions  Other Precautions: fall    Medical Diagnosis: Localized osteoarthrosis not specified whether primary or secondary, pelvic region and thigh [715.35] (LEFT HIP DJD)  Localized osteoarthrosis not specified whether primary or secondary, pelvic region and thigh    Rehab Diagnosis: pain in joint L hip, generalized muscle weakness, decreased coordination      History of Present Illness: Jerry Hickman is a 57 y.o. male admitted on 03/10/2013 s/p anterior L THR    Patient Active Problem List   Diagnosis   . History of sarcoidosis   . Essential hypertension, benign   . Mixed hyperlipidemia   . ED (erectile dysfunction)   . GERD (gastroesophageal reflux disease)   . Localized osteoarthrosis not specified whether  primary or secondary, pelvic region and thigh        Past Medical/Surgical History:  Past Medical History   Diagnosis Date   . Depression screening negative 12-01-12     Neg   . Chronic hip pain    . Abnormal vision      reading   . H/O splenomegaly      Denies this condition   . Hypertensive disorder      on med   . Hyperlipidemia      on med   . Headache following lumbar puncture 2004     s/p spinal tap .... blood patch needed   . GERD (gastroesophageal reflux disease)      on med   . Arthritis      L hip   . Difficulty in walking(719.7)      limp .Marland KitchenMarland Kitchenpending surgery      Past Surgical History   Procedure Date   . Replacement total knee 01-18-11     right   . Total hip arthroplasty 11-2004     Right   . Colonoscopy 2014   . Joint replacement      hip and knee   . Arthroplasty, hip, total, anterior approach, c arm 03/10/2013     Procedure: ARTHROPLASTY, HIP, TOTAL, ANTERIOR APPROACH, C ARM;  Surgeon: Cynda Familia, MD;  Location: [place] [facility];  Service: Orthopedics;  Laterality: Left;  LEFT  HIP THA ANT           Social History:  Prior level of function: Ambulates / Performs ADL's independently  Assistive Device: None  Baseline Activity Level: Community ambulation  DME Currently at Home: Crutches  Home Living Arrangements  Living Arrangements: Spouse/significant other  Type of Home: House  Home Layout: Multi-level  DME Currently at Home: Crutches    Subjective:   Patient is agreeable to participation in the therapy session. Nursing clears patient for therapy.      Pain Assessment: Numeric Scale (0-10)  Pain Score: 5-moderate pain  POSS Score: Awake and Alert  Pain Location: Hip  Pain Orientation: Left  Pain Frequency: Increases with movement  Effect of Pain on Daily Activities: mild  Pain Intervention(s): Cold applied    Objective:  Patient is in bed with dressings, hemovac and IV in place.    Cognitive Status and Neuro Exam:  Cognition  Arousal/Alertness: Appropriate responses to stimuli  Attention Span: Appears  intact  Orientation Level: Oriented X4  Memory: Appears intact  Following Commands: independent  Safety Awareness: minimal verbal instruction  Insights: Fully aware of deficits  Neuro Status  Behavior: calm cooperative  Motor Planning: intact  Coordination: intact       Musculoskeletal Examination  Gross ROM  Right Upper Extremity ROM: within functional limits  Left Upper Extremity ROM: within functional limits  Gross Strength  Right Upper Extremity Strength: within functional limits  Left Upper Extremity Strength: within functional limits           Sensory/Oculomotor Examination  Auditory: intact  Tactile - Light Touch: intact  Visual Acuity: intact;wears glasses  Vision - Complex Assessment  Ocular Range of Motion: Within Functional Limits  Head Position: Within Defined Limits  Tracking: Able to track stimulus in all quads without difficulty    Activities of Daily Living  Self-care and Home Management  Eating: independently;in a chair  Grooming: independently;standing at sink  Bathing: independently;standing at sink  UE Dressing: independently;sitting;at edge of bed  LE Dressing: independently;standing;sitting;edge of bed  Toileting: independently;use of adaptive equipment  Functional Transfers: independently;toilet transfer (supervision shower)    Functional Mobility:  Mobility and Transfers  Supine to Sit: Independently  Sit to Supine: Independently  Sit to Stand: Independently  Bed to Chair: Independently     Balance  Static Sitting Balance: good  Dyanamic Sitting Balance: good  Static Standing Balance: good  Dynamic Standing Balance: fair    Participation and Activity Tolerance  Participation Effort: good  Endurance: Endurance does not limit participation in activity    Treatment:  Therapeutic Activity:shower transfer education      Signature: Darci Needle, OT  03/11/2013  9:53 AM

## 2013-03-11 NOTE — Progress Notes (Signed)
Pt interviewed and denies any anesthestic complications.

## 2013-03-11 NOTE — Progress Notes (Signed)
Referral received for home PT, patient has outpatient PT scheduled for 15 Dec. Patient prefers outpatient PT due to ortho surgeries in the past. He was able to make his outpatient PT appointment for tomorrow at 0930. Patient deferred for home care.

## 2013-03-11 NOTE — Progress Notes (Signed)
[facility]  Physical Therapy Treatment    Patient:  Jerry Hickman MRN#:  [id number]  Unit:  5NEW ORTHOPEDICS Room/Bed:  V409/W119-14    Assessment:   After today's treatment patient able to ascend and descend steps with crutc/rail and min cues; coach trained to assist as needed.  Assessment: Decreased endurance/activity tolerance;Decreased LE ROM;Decreased LE strength;Gait impairment.  Prognosis: Good;With continued PT status post acute discharge    Interdisciplinary Communication:   Patient up in bedside chair with ice and wife and call bell within reach. Communicated with nurse regarding discharge.    Education:   Further education provided to patient/caregiver stair training HEP, safety with mobility and ADLs, hip precautions, weight bearing precautions, discharge instructions, home safety.  Demonstrated good understanding with all information.  Will benefit from further training to focus on functional strength and endurance.    Plan:      Plan  Treatment/Interventions: Exercise;Gait training  Progress: Improving as expected  PT Frequency: 6-7x/wk    Continue plan of care.    Recommendation  Discharge Recommendation: Home with outpatient PT  DME Recommended for Discharge: Crutches  PT - Next Visit Recommendation: 03/12/13  PT Frequency: 6-7x/wk    Treatment:     Time of treatment: Start Time: 0950 Stop Time: 1019  Time Calculation (min): 29 min  PT Received On: 03/11/13    Treatment #      Precautions  Weight Bearing Status: LLE WBAT  Total Hip Replacement: anterior precautions  Other Precautions: fall    Patient's medical condition is appropriate for Physical Therapy intervention at this time.     Subjective:   Patient is agreeable to participation in the therapy session. Nursing clears patient for therapy.      Pain Assessment  Pain Assessment: No/denies pain  Pain Intervention(s): Cold applied;Repositioned;Ambulation/increased activity          Objective:  Patient is seated in a bedside chair  with dressings in place.       Functional Mobility  Sit to Stand: Independent  Stand to Sit: Independent  Transfers  Device Used for Functional Transfer: crutches  Locomotion  Ambulation: independently (crutches used to slow patient down for safety)  Ambulation Distance (Feet): 250 Feet  Stair Management: with gait belt;with crutches;forward;step to pattern;one rail R;modified independent (device) (safety and sequence cues)  Number of Stairs: 4     Therapeutic Exercise  Strengthening:  (reviewed protocol; stding heel raises, squats and ABd)    Neuro Re-Ed  Standing Balance: independent (with and without RW)    Signature: Bernadene Person, LPTA

## 2013-03-11 NOTE — Discharge Instructions (Addendum)
Post-Operative Instructions for Total Hip Replacement  Mark R. McMahon, M.D.  [phone]      1.   Elevate the leg (from under the heel and ankle only) and apply an ice pack for 20           minutes every hour as needed to help reduce pain and swelling.  Work on range         of motion of the hip as tolerated.    2.  Use walker to walk.  You can put as much weight on your leg as you can tolerate. Wean from walker to a cane as tolerated.    3. Change your dressing daily.  When the dressing has been dry for 48 hours, you may leave it open to air and get it wet in the shower.  No baths/hot tubs/pools for 4 weeks.    4. You may not drive until evaluated by me in the office and I give you clearance.    5. If home therapy has been arranged for you, schedule outpatient therapy to begin after your first postop visit.  I will give you the prescription at your postop visit.    6. Take your Xarelto daily for the next 30 days for blood clot prevention.  Do not take any anti-inflammatories while on this medication (advil, aleve, ibuprofen, aspirin etc.)    7. Make a postoperative appointment with my office within 10-14 days after surgery by calling [phone].    If you develop severe pain, redness, swelling in the leg, difficulty breathing, or fever greater than 101.5F call my office.     General Guidelines After Your Joint Replacement    Prevention and Recognition of Blood Clots  1. Remember to take your blood thinner medication (ie, coumadin, Xarelto, aspirin) as directed for as long as your surgeon has prescribed it.  2. Move! Pump your ankles when you're in the bed or chair, and take frequent short walks as tolerated. If your surgeon sent you home with the white TED stockings, wear them throughout the day and especially at night, taking them off only to shower. They are machine washable and dryable.  3. Unrelieved pain in either calf, particularly if accompanied by lower leg swelling, redness, and warmth or heat in the  area can indicate a possible blood clot. If this occurs, call your surgeon's office or visit the nearest emergency room for evaluation.  4. Chest pain, shortness of breath, fast heart rate and/or a "feeling of impending doom" can signify a blood clot that has traveled to the lungs. Call 911 immediately!    Prevention and Recognition of Infection  1. Follow your surgeon's guidelines for showering and changing the bandage. Make sure whoever changes the bandage thoroughly washes his/her hands, including nailbeds, with soap and water before beginning the procedure.  2. Do not touch or rub the incision or the gauze area of the bandage.  3. Do not soak in a bathtub, swim or otherwise immerse the incision area in water until cleared by your surgeon.  4. Do not apply any ointments, including lotions or even an antibiotic solution such as Bacitracin, until cleared by your surgeon.  5. Signs of infection include increased swelling and/or pain at the incision site, increased redness, a feeling of warmth or heat, and/or pus-like discharge. You may or may not have an elevated temperature. If any of these signs occur, call your surgeon's office.  6. Now that you have an artifical joint, you will need   to take an antibiotic one hour before any dental visits, including cleaning appointments, to help prevent bacteria from your mouth from traveling to and settling in your implant. Tell your dentist about your surgery when you make your next dental appointment, and he/she should be able and willing to call a prescription in to your pharmacy of choice. If your dentist refuses, or says that you don't need to take the antibiotic after a couple of years, call your surgeon or your primary care provider for a prescription. You will need to take this pre-procedure antibiotic for the rest of your life.  7. Be proactive about other possible infections. If you think you have a urinary tract or other kind of infection, or if you have a cut that  appears to be getting infected, visit your primary care doctor right away instead of taking a "wait-and-see" approach.  8. You no longer have to use the Hibiclens antibacterial soap that you used prior to surgery.    Alleviating Constipation  1. It will most likely be a few days before you have your first bowel movement after surgery, due to the anesthesia, your relative immobility, and the effects of pain medication. To help counteract, stay hydrated -- prune juice and hot liquids such as coffee or tea can help get the bowels moving. Also take an over-the-counter stool softener as directed by your surgeon.  2. Move! Walking will help get your bowels moving.  3. Some people report relief by placing a warm heating pad over their lower belly while resting.  4. If you are still unable to have a bowel movement, contact your surgeon's office.  5. If you become unable to keep down any food that you eat, are unable to pass gas, and your abdomen becomes increasingly distended or swollen, go to the nearest emergency room.    Pain  1. Stay on top of your pain medication. Once your pain level gets too high, it's hard to play catch up with your pain medications. Definitely take it before your physical therapy appointments! (Do not take narcotics if you are driving yourself to your therapy!)  2. Do not take anti-inflammatories such as ibuprofen, Advil, Aleve, Motrin, etc., until you've finished your blood thinner medication and you've been given the okay by your surgeon. You may take Tylenol for mild pain, but do not take acetaminophen products in addition to your Percocet or Norco, since they also contain Tylenol.  2. Keep ice on the joint 20 minutes at a time throughout the day. Make sure that you have a layer of clothing or towel between the ice pack and your skin, especially if you have had a nerve block!  3. You can make your own ice packs by pouring one or two bottles of clear Karo syrup (from the baking aisle) into a  gallon-size heavy duty freezer bag, placing that inside of another freezer bag to prevent leaks, and placing it in the freezer. This will stay cold for a long time and forms a nice, thick heavy gel. You can also use equal parts rubbing alcohol and water.    Please call your surgeon's office with any medical questions or concerns. For general questions, contact the orthopedic navigator, Lekeya Rollings DuTeil ([phone], or email [email or web address] [email or web address]) Monday through Friday.       Rivaroxaban Oral tablet  What is this medicine?  RIVAROXABAN (ri Hoyt ROX a ban) is an anticoagulant (blood thinner). It is used to treat blood clots in the lungs or in the   veins. It is also used after knee or hip surgeries to prevent blood clots. It is also used to lower the chance of stroke in people with a medical condition called atrial fibrillation.  This medicine may be used for other purposes; ask your health care provider or pharmacist if you have questions.  What should I tell my health care provider before I take this medicine?  They need to know if you have any of these conditions:   bleeding disorders   bleeding in the brain   blood in your stools (black or tarry stools) or if you have blood in your vomit   history of stomach bleeding   kidney disease   liver disease   low blood counts, like low white cell, platelet, or red cell counts   recent or planned spinal or epidural procedure   take medicines that treat or prevent blood clots   an unusual or allergic reaction to rivaroxaban, other medicines, foods, dyes, or preservatives   pregnant or trying to get pregnant   breast-feeding  How should I use this medicine?  Take this medicine by mouth with a glass of water. Follow the directions on the prescription label. Take your medicine at regular intervals. Do not take it more often than directed. Do not stop taking except on your doctor's advice.  If you are taking this medicine after hip or knee replacement surgery, take it  with or without food.  If you are taking this medicine for atrial fibrillation, take it with your evening meal. If you are taking this medicine to treat blood clots, take it with food at the same time each day. If you are unable to swallow your tablet, you may crush the tablet and mix it in applesauce. Then, immediately eat the applesauce. You should eat more food right after you eat the applesauce containing the crushed tablet.  Talk to your pediatrician regarding the use of this medicine in children. Special care may be needed.  Overdosage: If you think you've taken too much of this medicine contact a poison control center or emergency room at once.  NOTE: This medicine is only for you. Do not share this medicine with others.  What if I miss a dose?  If you take your medicine once a day and miss a dose, take the missed dose as soon as you remember. If you take your medicine twice a day and miss a dose, take the missed dose immediately. In this instance, 2 tablets may be taken at the same time. The next day you should take 1 tablet twice a day as directed.  What may interact with this medicine?   aspirin and aspirin-like medicines   certain antibiotics like erythromycin, azithromycin, and clarithromycin   certain medicines for fungal infections like ketoconazole and itraconazole   certain medicines for irregular heart beat like amiodarone, quinidine, dronedarone   certain medicines for seizures like carbamazepine, phenytoin   certain medicines that treat or prevent blood clots like warfarin, enoxaparin, and dalteparin   conivaptan   diltiazem   felodipine   indinavir   lopinavir; ritonavir   NSAIDS, medicines for pain and inflammation, like ibuprofen or naproxen   ranolazine   rifampin   ritonavir   St. John's wort   verapamil  This list may not describe all possible interactions. Give your health care provider a list of all the medicines, herbs, non-prescription drugs, or dietary supplements you  use. Also tell them if you smoke, drink alcohol, or   use illegal drugs. Some items may interact with your medicine.  What should I watch for while using this medicine?  Do not stop taking this medicine without first talking to your doctor. Stopping this medicine may increase your risk of having a stroke. Be sure to refill your prescription before you run out of medicine.  This medicine may increase your risk to bruise or bleed. Call your doctor or health care professional if you notice any unusual bleeding.  Be careful brushing and flossing your teeth or using a toothpick because you may bleed more easily. If you have any dental work done, tell your dentist you are receiving this medicine.  What side effects may I notice from receiving this medicine?  Side effects that you should report to your doctor or health care professional as soon as possible:   allergic reactions like skin rash, itching or hives, swelling of the face, lips, or tongue   bloody or black, tarry stools   changes in vision   confusion, trouble speaking or understanding   red or dark-brown urine   redness, blistering, peeling or loosening of the skin, including inside the mouth   severe headaches   spitting up blood or brown material that looks like coffee grounds   sudden numbness or weakness of the face, arm or leg   trouble walking, dizziness, loss of balance or coordination   unusual bruising or bleeding from the eye, gums, or nose  Side effects that usually do not require medical attention (Report these to your doctor or health care professional if they continue or are bothersome.):   dizziness   muscle pain  This list may not describe all possible side effects. Call your doctor for medical advice about side effects. You may report side effects to FDA at 1-800-FDA-1088.  Where should I keep my medicine?  Keep out of the reach of children.  Store at room temperature between 15 and 30 degrees C (59 and 86 degrees F). Throw away any  unused medicine after the expiration date.  NOTE: This sheet is a summary. It may not cover all possible information. If you have questions about this medicine, talk to your doctor, pharmacist, or health care provider.  NOTE:This sheet is a summary. It may not cover all possible information. If you have questions about this medicine, talk to your doctor, pharmacist, or health care provider. Copyright 2014 [employer]          MEDICATION: OXYCODONE AND ACETAMINOPHEN  Oxycodone and acetaminophen (brand names: Tylox, Endocet, Roxicet, Endocet, Percocet, and others) is a narcotic pain medicine. Be sure to take it only as directed.  DIRECTIONS FOR USE:  If this medicine upsets your stomach, take it with food. Pain medicine should be taken only if needed at the times prescribed. If you are not having pain, do not take the medicine unless you are advised to do so by your doctor. Limit your use of acetaminophen-containing products while taking this medicine. Too much acetaminophen may cause liver damage.  WHAT TO WATCH FOR  POSSIBLE SIDE EFFECTS: Dizziness, drowsiness (Rise slowly from a seated or lying position). Constipation (Drink lots of liquids, eat a high-fiber diet, use small doses of a mild laxative like milk of magnesia as needed). Nausea, vomiting (Take the medicine with food, eat small meals, lie down. If persistent or severe, contact your doctor). Yellowing of eyes/skin, dark urine, unusual stomach pain (Contact your doctor). Difficulty passing urine, fainting, trouble breathing, difficulty waking up, chest pain, fast/irregular   heartbeats, seizures (Stop the medicine and contact your doctor or return to this facility promptly).  ALLERGIC REACTION: Rash, itching, swelling, trouble breathing or swallowing (Contact your doctor or return to this facility promptly).  MEDICAL CONDITIONS: Before starting this medicine, be sure your doctor knows if you have any of the following conditions:   Prostate enlargement,  liver disease   Breathing problems, kidney disease   Stomach/intestinal problems   History of head injury or seizures   Alcohol or drug dependency   Pregnancy or breastfeeding  DRUG INTERACTIONS: Before starting this medicine, be sure your doctor knows if you are taking any of the following drugs: narcotic blockers (naltrexone), alvimopan, conivaptan, dasatinib/imatinib, macrolide antibiotics, isonazid, itraconazole/ketoconazole, SSRI antidepressants (fluoxetine, sertaline, and others), protease inhibitors, serotonin agonists, sibutramine, other products containing acetaminophen, bile acid binding resins, voriconazole   Barbiturates, Tegretol (carbamazepine), Dilantin (phenytoin), rifampin   Coumadin (warfarin)   Other pain medicines  WARNINGS:   Do not drive, ride a bicycle, or operate dangerous equipment while taking this medicine until you know how it will affect you.   This drug may cause increased side effects when taken with alcohol, muscle relaxants, sedatives, tricyclic antidepressants, MAO-inhibitors, or another pain medicine. Alcohol use may also increase the risk of liver damage when taken with medicines that contain acetaminophen.   Prolonged use of this medicine can be habit forming and may lead to addiction.  [NOTE: This information topic may not include all directions, precautions, medical conditions, drug/food interactions, and warnings for this drug. Check with your doctor, nurse, or pharmacist for any questions that you may have.]   2000-2012 [employer], [address], [place] [address]. All rights reserved. This information is not intended as a substitute for professional medical care. Always follow your healthcare professional's instructions.

## 2013-03-11 NOTE — Progress Notes (Signed)
CM Hendry planning follow up: Post op: L THA. [facility] plan for home with outpatient follow up pending PMR eval. Referral to [facility] liason for possible HH and DME needs. CM will cont to follow as needed.

## 2013-03-11 NOTE — Plan of Care (Signed)
Problem: Moderate/High Fall Risk Score >/=15  Goal: Patient will remain free of falls  Outcome: Progressing  Pt's bed at lowest position. Call bell with in reach. Pt encourage to call before getting up out of bed.   Bed alarm on    Problem: Day of Surgery- Hip Surgery  Goal: Free from Infection  Outcome: Progressing  Hip dressing C/D/I   Goal: Neurovascular Status is Stable  Outcome: Progressing  neurovascularly intact   Goal: Hemodynamic Stability  Outcome: Progressing  Pt voiding without difficulty. Hemovac drain intact and patent, draining bloody drainage   Goal: Mobility/activity is maintained at optimum level for patient  Outcome: Progressing  Pt ambulated in room with FWW. Gait steady. Pt tolerated activity well  Goal: Pain at adequate level as identified by patient  Outcome: Progressing  Pt's pain managed with percocet prn. Continue to monitor pain and treat prn.

## 2013-03-18 ENCOUNTER — Encounter (INDEPENDENT_AMBULATORY_CARE_PROVIDER_SITE_OTHER): Payer: Self-pay

## 2013-03-18 MED FILL — Bacteriostatic Sodium Chloride Inj Soln 0.9%: INTRAMUSCULAR | Qty: 30 | Status: AC

## 2013-04-07 ENCOUNTER — Encounter (INDEPENDENT_AMBULATORY_CARE_PROVIDER_SITE_OTHER): Payer: Self-pay

## 2013-05-03 ENCOUNTER — Other Ambulatory Visit (INDEPENDENT_AMBULATORY_CARE_PROVIDER_SITE_OTHER): Payer: Self-pay

## 2013-05-03 DIAGNOSIS — E785 Hyperlipidemia, unspecified: Secondary | ICD-10-CM

## 2013-05-03 MED ORDER — ATORVASTATIN CALCIUM 10 MG PO TABS
10.0000 mg | ORAL_TABLET | Freq: Every day | ORAL | Status: DC
Start: 2013-05-03 — End: 2013-05-12

## 2013-05-08 ENCOUNTER — Other Ambulatory Visit (INDEPENDENT_AMBULATORY_CARE_PROVIDER_SITE_OTHER): Payer: Self-pay | Admitting: Family Medicine

## 2013-05-10 ENCOUNTER — Ambulatory Visit (INDEPENDENT_AMBULATORY_CARE_PROVIDER_SITE_OTHER): Payer: BC Managed Care – PPO | Admitting: Family Medicine

## 2013-05-10 ENCOUNTER — Encounter (INDEPENDENT_AMBULATORY_CARE_PROVIDER_SITE_OTHER): Payer: Self-pay | Admitting: Family Medicine

## 2013-05-10 VITALS — BP 135/86 | HR 62 | Temp 98.0°F | Resp 20 | Ht 68.7 in | Wt 234.0 lb

## 2013-05-10 DIAGNOSIS — R739 Hyperglycemia, unspecified: Secondary | ICD-10-CM

## 2013-05-10 DIAGNOSIS — Z0000 Encounter for general adult medical examination without abnormal findings: Secondary | ICD-10-CM

## 2013-05-10 DIAGNOSIS — Z862 Personal history of diseases of the blood and blood-forming organs and certain disorders involving the immune mechanism: Secondary | ICD-10-CM

## 2013-05-10 DIAGNOSIS — E785 Hyperlipidemia, unspecified: Secondary | ICD-10-CM

## 2013-05-10 DIAGNOSIS — Z8639 Personal history of other endocrine, nutritional and metabolic disease: Secondary | ICD-10-CM

## 2013-05-10 DIAGNOSIS — I10 Essential (primary) hypertension: Secondary | ICD-10-CM

## 2013-05-10 DIAGNOSIS — R7309 Other abnormal glucose: Secondary | ICD-10-CM

## 2013-05-10 NOTE — Progress Notes (Signed)
Since your last visit, have you seen any new specialist or physicians?    Yes    Care team has been updated.

## 2013-05-10 NOTE — Progress Notes (Signed)
Subjective:       Patient ID: Jerry Hickman is a 58 y.o. male.    HPI    The patient is here for his annual physical and wellness visit.      He is feeling well.  He is having no real problems.  2 months ago he had his left hip replaced.  He has put on a couple of pounds, but is doing well.  He is going to the gym, and has been progressing well.    He is now on lipitor instead of crestor due to cost.  He is having no problems with the change and is feeling well.    He is here to follow up on his elevated sugar.  It was high at his pre op.  He still enjoys his carbs.  He is trying to work on this.  His downfall is bread.    He does need refills of his medication.  He is doing well.    He is here for follow up of his hypertension.  He is doing well.  His pressures are running about 130s/80s.  He is having no problems with his medication.    He is now off xarelto.  This was for DVT prophylaxis.    The following portions of the patient's history were reviewed and updated as appropriate:  current medications, allergies, past family history, past medical history, past social history, past surgical history and problem list.      Review of Systems   Constitutional: Negative.  Negative for fever, chills, diaphoresis, activity change, appetite change, fatigue and unexpected weight change.   HENT: Negative.  Negative for congestion, ear pain, hearing loss, nosebleeds, rhinorrhea, sneezing, sore throat, trouble swallowing and voice change.    Eyes: Negative.  Negative for pain and visual disturbance.   Respiratory: Negative.  Negative for cough, chest tightness and shortness of breath.    Cardiovascular: Negative.  Negative for chest pain and palpitations.   Gastrointestinal: Negative.  Negative for nausea, vomiting, abdominal pain, constipation and blood in stool.   Genitourinary: Negative.  Negative for dysuria, urgency, frequency, hematuria, flank pain, decreased urine volume, scrotal swelling, difficulty urinating and  testicular pain.   Musculoskeletal: Negative.  Negative for arthralgias, myalgias and neck pain.   Skin: Negative.  Negative for rash.   Neurological: Negative.  Negative for dizziness, tremors, syncope, weakness, numbness and headaches.   Hematological: Negative.  Negative for adenopathy. Does not bruise/bleed easily.   Psychiatric/Behavioral: Negative.  Negative for confusion, sleep disturbance and dysphoric mood. The patient is not nervous/anxious.            Objective:    Physical Exam   Constitutional: He is oriented to person, place, and time. He appears well-developed and well-nourished. No distress.   HENT:   Head: Normocephalic and atraumatic.   Right Ear: External ear normal.   Left Ear: External ear normal.   Nose: Nose normal.   Mouth/Throat: Oropharynx is clear and moist. No oropharyngeal exudate.   Eyes: Conjunctivae normal and EOM are normal. Pupils are equal, round, and reactive to light. Right eye exhibits no discharge. No scleral icterus.   Neck: Neck supple. No JVD present. No thyromegaly present.   Cardiovascular: Normal rate, regular rhythm and normal heart sounds.  Exam reveals no gallop and no friction rub.    No murmur heard.  Pulmonary/Chest: Effort normal and breath sounds normal. No respiratory distress. He has no wheezes. He has no rales.   Abdominal: Soft.  Bowel sounds are normal. He exhibits no distension and no mass. There is no tenderness. There is no rebound and no guarding. Hernia confirmed negative in the right inguinal area and confirmed negative in the left inguinal area.   Genitourinary: Rectum normal, prostate normal, testes normal and penis normal. Rectal exam shows no mass, no tenderness and anal tone normal. Circumcised.   Musculoskeletal: Normal range of motion. He exhibits no edema and no tenderness.   Lymphadenopathy:     He has no cervical adenopathy.        Right: No inguinal adenopathy present.        Left: No inguinal adenopathy present.   Neurological: He is alert  and oriented to person, place, and time. No cranial nerve deficit. He exhibits normal muscle tone. Coordination normal.   Skin: Skin is warm and dry. No rash noted. He is not diaphoretic.   Psychiatric: He has a normal mood and affect. His behavior is normal. Thought content normal.     Blood pressure 135/86, pulse 62, temperature 98 F (36.7 C), temperature source Oral, resp. rate 20, height 1.745 m (5' 8.7"), weight 106.142 kg (234 lb), SpO2 99.00%.        Assessment:       1. Annual physical and wellness visit. Overall he is doing extremely well. He is feeling good. He is now back to exercising since his hip has been replaced. He is started to work on his diet again. He is compliant with his medications. He is not having any sinus cognitive loss at this point. He is having a depression. He is up-to-date on his routine screenings. He did not however get a flu shot this year. Overall however I find him to be very healthy at this point.    2. Hypertension. His blood pressure illness doing very well. His running generally in the 130s over 80s. He is having no problems with his medication. I will have him continue the same.    3. Hyperlipidemia. He is doing well with the Lipitor. Had no side effects. I will do some blood work today to assure that his levels are very well-controlled with Lipitor as well as they were with the Crestor. Given the change was made from Crestor to Lipitor due to formulary changes.    4. 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 However it was of the cost of his joints. 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 He also has osteoarthritis of the knees. He's had both hips replaced and 1 knee replaced. He is very understanding of this side effect from steroids as the sarcoid is now on no longer symptomatic or issue.      Plan:       1. Counseling and anticipatory guidance was offered covering nutrition, physical activity, healthy weight, injury prevention,  misuse of tobacco, alcohol, and drugs, contraception and STDs as appropriate, dental health, mental health, immunizations and screenings.  2. Work on diet and exercise. He needs to optimize his BMI to help control his blood pressure. He understands the importance of doing both.  3. Continue his current medications  4. CBC, CMP, lipids.  5. We discussed PSA testing. He does have a family history. He is not having symptoms. His exam is benign. We will do a PSA today.  6. If he is continuing to do well I would like to see him back in 3 months.  7. He does have a history of some elevated  blood sugar. He is modified his diet. I will check an A1c today.  A. Status post hip replacement he is doing great. His scar on his left hip is healing nicely. He has good range of motion. He doing very well with this.

## 2013-05-11 LAB — COMPREHENSIVE METABOLIC PANEL
ALT: 35 U/L (ref 9–46)
AST (SGOT): 21 U/L (ref 10–35)
Albumin/Globulin Ratio: 2 (ref 1.0–2.5)
Albumin: 4.7 G/DL (ref 3.6–5.1)
Alkaline Phosphatase: 67 U/L (ref 40–115)
BUN: 13 MG/DL (ref 7–25)
Bilirubin, Total: 0.8 MG/DL (ref 0.2–1.2)
CO2: 25 mmol/L (ref 19–30)
Calcium: 9.6 MG/DL (ref 8.6–10.3)
Chloride: 103 mmol/L (ref 98–110)
Creatinine: 1.09 mg/dL (ref 0.70–1.33)
EGFR African American: 88 mL/min/{1.73_m2} (ref >=60)
EGFR: 76 mL/min/{1.73_m2} (ref >=60)
Globulin: 2.3 G/DL (ref 1.9–3.7)
Glucose: 87 MG/DL (ref 65–99)
Potassium: 4.1 mmol/L (ref 3.5–5.3)
Protein, Total: 7 G/DL (ref 6.1–8.1)
Sodium: 139 mmol/L (ref 135–146)

## 2013-05-11 LAB — CBC AND DIFFERENTIAL
Atypical Lymphocytes %: 0 %
Baso(Absolute): 12 cells/uL (ref 0–200)
Basophils: 0.2 %
Eosinophils Absolute: 136 cells/uL (ref 15–500)
Eosinophils: 2.3 %
Hematocrit: 44.1 % (ref 38.5–50.0)
Hemoglobin: 14.6 g/dL (ref 13.2–17.1)
Lymphocytes Absolute: 1817 cells/uL (ref 850–3900)
Lymphocytes: 30.8 %
MCH: 30.8 pg (ref 27–33)
MCHC: 33 g/dL (ref 32–36)
MCV: 93 fL (ref 80–100)
MPV: 9.3 fL (ref 7.5–11.5)
Monocytes Absolute: 419 cells/uL (ref 200–950)
Monocytes: 7.1 %
Neutrophils Absolute: 3516 cells/uL (ref 1500–7800)
Neutrophils: 59.6 %
Platelets: 183 10*3/uL (ref 140–400)
RBC: 4.73 10*6/uL (ref 4.20–5.80)
RDW: 13.8 % (ref 11.0–15.0)
WBC: 5.9 10*3/uL (ref 3.8–10.8)

## 2013-05-11 LAB — LIPID PANEL
Cholesterol / HDL Ratio: 3 (ref 0.0–5.0)
Cholesterol: 188 MG/DL (ref 125–200)
HDL: 63 mg/dL (ref >=40)
LDL Calculated: 92 mg/dL (ref <130)
Non HDL Cholesterol (LDL and VLDL): 125 mg/dL
Triglycerides: 164 MG/DL — ABNORMAL HIGH (ref <150)

## 2013-05-11 LAB — HEMOGLOBIN A1C: Hemoglobin A1C: 5.9 % — ABNORMAL HIGH (ref <5.7)

## 2013-05-11 NOTE — Addendum Note (Signed)
Addended by: Chrisandra Carota on: 05/11/2013 08:42 PM     Modules accepted: Orders

## 2013-05-12 ENCOUNTER — Other Ambulatory Visit (INDEPENDENT_AMBULATORY_CARE_PROVIDER_SITE_OTHER): Payer: Self-pay

## 2013-05-12 DIAGNOSIS — E785 Hyperlipidemia, unspecified: Secondary | ICD-10-CM

## 2013-05-12 MED ORDER — ATORVASTATIN CALCIUM 10 MG PO TABS
10.0000 mg | ORAL_TABLET | Freq: Every day | ORAL | Status: DC
Start: 2013-05-12 — End: 2013-10-05

## 2013-05-14 ENCOUNTER — Telehealth (INDEPENDENT_AMBULATORY_CARE_PROVIDER_SITE_OTHER): Payer: Self-pay

## 2013-05-14 ENCOUNTER — Other Ambulatory Visit (INDEPENDENT_AMBULATORY_CARE_PROVIDER_SITE_OTHER): Payer: Self-pay

## 2013-05-14 DIAGNOSIS — K219 Gastro-esophageal reflux disease without esophagitis: Secondary | ICD-10-CM

## 2013-05-14 DIAGNOSIS — I10 Essential (primary) hypertension: Secondary | ICD-10-CM

## 2013-05-14 MED ORDER — SILDENAFIL CITRATE 50 MG PO TABS
50.0000 mg | ORAL_TABLET | ORAL | Status: DC | PRN
Start: 2013-05-14 — End: 2020-07-17

## 2013-05-14 MED ORDER — RIVAROXABAN 10 MG PO TABS
10.0000 mg | ORAL_TABLET | Freq: Every morning | ORAL | Status: DC
Start: 2013-05-14 — End: 2014-09-20

## 2013-05-14 MED ORDER — PANTOPRAZOLE SODIUM 20 MG PO TBEC
20.0000 mg | DELAYED_RELEASE_TABLET | Freq: Every day | ORAL | Status: DC
Start: 2013-05-14 — End: 2014-08-29

## 2013-05-14 MED ORDER — AMLODIPINE BESYLATE 10 MG PO TABS
10.0000 mg | ORAL_TABLET | Freq: Every day | ORAL | Status: DC
Start: 2013-05-14 — End: 2014-04-24

## 2013-05-14 NOTE — Telephone Encounter (Signed)
Pt also requested Viagra. He said you Rx'd in the past. Not on current med list.   Thanks

## 2013-05-14 NOTE — Telephone Encounter (Signed)
Pt also wants Viagra ( not on med list )

## 2013-05-14 NOTE — Telephone Encounter (Signed)
Message copied by Eddie North on Fri May 14, 2013  1:14 PM  ------       Message from: Earleen Newport       Created: Fri May 14, 2013 12:29 PM       Regarding: Jerry Hickman       Contact: [phone]         Pt is also requesting Viagra prescription, states he has been given it before.               pantoprazole (PROTONIX) 20 MG tablet, 1 daily              amLODIPine (NORVASC) 10 MG tablet, 1 daily              rivaroxaban (XARELTO) 10 MG Tab, 1 daily              [id number] [place], [place] - [address] AT [facility] [phone] (Phone)       [phone] (Fax)

## 2013-05-14 NOTE — Telephone Encounter (Signed)
Dose of vicadin is 50mg .  [facility] [place] [address]

## 2013-05-14 NOTE — Telephone Encounter (Signed)
Which dose?  Where does he want it sent?

## 2013-10-05 ENCOUNTER — Other Ambulatory Visit (INDEPENDENT_AMBULATORY_CARE_PROVIDER_SITE_OTHER): Payer: Self-pay

## 2013-10-05 DIAGNOSIS — E785 Hyperlipidemia, unspecified: Secondary | ICD-10-CM

## 2013-10-05 MED ORDER — ATORVASTATIN CALCIUM 10 MG PO TABS
10.0000 mg | ORAL_TABLET | Freq: Every day | ORAL | Status: DC
Start: 2013-10-05 — End: 2013-10-06

## 2013-10-05 NOTE — Telephone Encounter (Signed)
Patient is at the beach and will need script sent to out of town pharmacy.

## 2013-10-06 ENCOUNTER — Other Ambulatory Visit (INDEPENDENT_AMBULATORY_CARE_PROVIDER_SITE_OTHER): Payer: Self-pay

## 2013-10-06 DIAGNOSIS — E785 Hyperlipidemia, unspecified: Secondary | ICD-10-CM

## 2013-10-06 MED ORDER — ATORVASTATIN CALCIUM 10 MG PO TABS
10.0000 mg | ORAL_TABLET | Freq: Every day | ORAL | Status: DC
Start: 2013-10-06 — End: 2014-04-24

## 2013-10-06 NOTE — Telephone Encounter (Signed)
Patients insurance will only cover Atorvastatin when request if for 90 tablets.

## 2014-02-15 ENCOUNTER — Ambulatory Visit (INDEPENDENT_AMBULATORY_CARE_PROVIDER_SITE_OTHER): Payer: BC Managed Care – PPO | Admitting: Family Medicine

## 2014-04-24 ENCOUNTER — Other Ambulatory Visit (INDEPENDENT_AMBULATORY_CARE_PROVIDER_SITE_OTHER): Payer: Self-pay | Admitting: Family Medicine

## 2014-07-21 ENCOUNTER — Other Ambulatory Visit (INDEPENDENT_AMBULATORY_CARE_PROVIDER_SITE_OTHER): Payer: Self-pay | Admitting: Family Medicine

## 2014-08-29 ENCOUNTER — Other Ambulatory Visit (INDEPENDENT_AMBULATORY_CARE_PROVIDER_SITE_OTHER): Payer: Self-pay | Admitting: Family Medicine

## 2014-08-29 DIAGNOSIS — K219 Gastro-esophageal reflux disease without esophagitis: Secondary | ICD-10-CM

## 2014-08-29 MED ORDER — PANTOPRAZOLE SODIUM 20 MG PO TBEC
20.0000 mg | DELAYED_RELEASE_TABLET | Freq: Every day | ORAL | Status: DC
Start: 2014-08-29 — End: 2015-07-21

## 2014-08-29 NOTE — Telephone Encounter (Signed)
Patient called for refill-    [phone]- NO MORE MEDICATION. NEEDED TODAY  pantoprazole (PROTONIX) 20 MG tablet  [id number] - [place], [place] - [address] AT [facility] OF [facility] [phone] (Phone)  [phone] (Fax)

## 2014-09-08 ENCOUNTER — Telehealth (INDEPENDENT_AMBULATORY_CARE_PROVIDER_SITE_OTHER): Payer: Self-pay | Admitting: Family Medicine

## 2014-09-08 NOTE — Telephone Encounter (Signed)
Let him know he is due to be seen

## 2014-09-09 NOTE — Telephone Encounter (Signed)
Called patient per dr. Lorrin Mais and left a vm to call us to make an appointment.

## 2014-09-20 ENCOUNTER — Encounter (INDEPENDENT_AMBULATORY_CARE_PROVIDER_SITE_OTHER): Payer: Self-pay | Admitting: Family Medicine

## 2014-09-20 ENCOUNTER — Ambulatory Visit (FREE_STANDING_LABORATORY_FACILITY): Payer: BC Managed Care – PPO | Admitting: Family Medicine

## 2014-09-20 VITALS — BP 124/76 | HR 62 | Temp 98.2°F | Resp 18 | Ht 69.5 in | Wt 226.2 lb

## 2014-09-20 DIAGNOSIS — R197 Diarrhea, unspecified: Secondary | ICD-10-CM

## 2014-09-20 DIAGNOSIS — I10 Essential (primary) hypertension: Secondary | ICD-10-CM

## 2014-09-20 DIAGNOSIS — E782 Mixed hyperlipidemia: Secondary | ICD-10-CM

## 2014-09-20 DIAGNOSIS — Z862 Personal history of diseases of the blood and blood-forming organs and certain disorders involving the immune mechanism: Secondary | ICD-10-CM

## 2014-09-20 LAB — CBC AND DIFFERENTIAL
Basophils Absolute Automated: 0.04 10*3/uL (ref 0.00–0.20)
Basophils Automated: 1 %
Eosinophils Absolute Automated: 0.52 10*3/uL (ref 0.00–0.70)
Eosinophils Automated: 10 %
Hematocrit: 44.1 % (ref 42.0–52.0)
Hgb: 14.9 g/dL (ref 13.0–17.0)
Immature Granulocytes Absolute: 0.01 10*3/uL
Immature Granulocytes: 0 %
Lymphocytes Absolute Automated: 1.52 10*3/uL (ref 0.50–4.40)
Lymphocytes Automated: 29 %
MCH: 31.7 pg (ref 28.0–32.0)
MCHC: 33.8 g/dL (ref 32.0–36.0)
MCV: 93.8 fL (ref 80.0–100.0)
MPV: 11.1 fL (ref 9.4–12.3)
Monocytes Absolute Automated: 0.38 10*3/uL (ref 0.00–1.20)
Monocytes: 7 %
Neutrophils Absolute: 2.7 10*3/uL (ref 1.80–8.10)
Neutrophils: 52 %
Nucleated RBC: 0 /100 WBC (ref 0–1)
Platelets: 164 10*3/uL (ref 140–400)
RBC: 4.7 10*6/uL (ref 4.70–6.00)
RDW: 13 % (ref 12–15)
WBC: 5.17 10*3/uL (ref 3.50–10.80)

## 2014-09-20 LAB — COMPREHENSIVE METABOLIC PANEL
ALT: 27 U/L (ref 0–55)
AST (SGOT): 23 U/L (ref 5–34)
Albumin/Globulin Ratio: 1.6 (ref 0.9–2.2)
Albumin: 4.1 g/dL (ref 3.5–5.0)
Alkaline Phosphatase: 67 U/L (ref 38–106)
BUN: 20 mg/dL (ref 9.0–28.0)
Bilirubin, Total: 0.7 mg/dL (ref 0.1–1.2)
CO2: 25 mEq/L (ref 21–30)
Calcium: 9.2 mg/dL (ref 8.5–10.5)
Chloride: 106 mEq/L (ref 100–111)
Creatinine: 1.1 mg/dL (ref 0.5–1.5)
Globulin: 2.6 g/dL (ref 2.0–3.7)
Glucose: 94 mg/dL (ref 70–100)
Potassium: 4.4 mEq/L (ref 3.5–5.3)
Protein, Total: 6.7 g/dL (ref 6.0–8.3)
Sodium: 139 mEq/L (ref 135–146)

## 2014-09-20 LAB — GFR: EGFR: >60

## 2014-09-20 LAB — LIPID PANEL
Cholesterol / HDL Ratio: 3.2
Cholesterol: 207 mg/dL — ABNORMAL HIGH (ref 0–199)
HDL: 64 mg/dL (ref >40)
LDL Calculated: 122 mg/dL — ABNORMAL HIGH (ref 0–99)
Triglycerides: 107 mg/dL (ref 34–149)
VLDL Calculated: 21 mg/dL (ref 10–40)

## 2014-09-20 LAB — HEMOLYSIS INDEX: Hemolysis Index: 6 (ref 0–18)

## 2014-09-20 NOTE — Progress Notes (Signed)
Subjective:       Patient ID: Jerry Hickman is a 59 y.o. male.    HPI  The patient is here for follow up of his hypertension.  He is having no problems with his medication.  He is watching his diet.    He is doing well with his liptor.  He is taking it reguarly.    He has reflux that is well controlled with low dose pantoprazole.  If he goes off he does get reflux      He is feeling great.  He has retired.  He is much more rested.  He is exercising a lot.  His weight is down and his stress is down.      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  He is enjoying this company.  All of his kids are out of college.    He has had diarrhea for the past 4-5 days  He has had only one episode today and no cramping.    The following portions of the patient's history were reviewed and updated as appropriate:  current medications, allergies, past family history, past medical history, past social history, past surgical history and problem list.      Review of Systems   Constitutional: Negative.  Negative for activity change, appetite change, fatigue and unexpected weight change.   HENT: Negative.  Negative for trouble swallowing.    Eyes: Negative.  Negative for pain and visual disturbance.   Respiratory: Negative.  Negative for cough, chest tightness and shortness of breath.    Cardiovascular: Negative.  Negative for chest pain and palpitations.   Gastrointestinal: Positive for diarrhea. Negative for abdominal pain, constipation and blood in stool.   Genitourinary: Negative.  Negative for dysuria, urgency, frequency and hematuria.   Musculoskeletal: Negative.  Negative for myalgias, arthralgias and neck pain.   Skin: Negative.    Neurological: Negative.  Negative for dizziness, tremors, syncope, weakness, numbness and headaches.   Hematological: Negative for adenopathy.   Psychiatric/Behavioral: Negative.  Negative for confusion and dysphoric mood. The patient is not nervous/anxious.             Objective:    Physical Exam   Constitutional: He is oriented to person, place, and time. He appears well-developed and well-nourished. No distress.   HENT:   Head: Normocephalic and atraumatic.   Right Ear: External ear normal.   Left Ear: External ear normal.   Nose: Nose normal.   Mouth/Throat: Oropharynx is clear and moist. No oropharyngeal exudate.   Eyes: Conjunctivae and EOM are normal. Pupils are equal, round, and reactive to light. Right eye exhibits no discharge. No scleral icterus.   Neck: Neck supple. No JVD present. No thyromegaly present.   Cardiovascular: Normal rate, regular rhythm and normal heart sounds.  Exam reveals no gallop and no friction rub.    No murmur heard.  Pulmonary/Chest: Effort normal and breath sounds normal. No respiratory distress. He has no wheezes. He has no rales.   Abdominal: Soft. Bowel sounds are normal. He exhibits no distension. There is no tenderness.   Musculoskeletal: Normal range of motion. He exhibits no edema or tenderness.   Lymphadenopathy:     He has no cervical adenopathy.   Neurological: He is alert and oriented to person, place, and time. No cranial nerve deficit.   Skin: Skin is warm and dry. No rash noted. He is not diaphoretic.   Psychiatric: He has a normal mood and  affect.     Blood pressure 124/76, pulse 62, temperature 98.2 F (36.8 C), temperature source Oral, resp. rate 18, height 1.765 m (5' 9.5"), weight 102.604 kg (226 lb 3.2 oz).        Assessment:       1.  Essential hypertension.  His blood pressure at home is been great.  He is feeling wonderful.  He is really working on diet and exercise.  I will check some blood work and have him continue the same medication.    2.  Mixed hyperlipidemia.  He is doing well with his medication.  Again, he is watching his diet and exercising.  I will check some blood work on him.    3.  History of sarcoidosis.  He has not needed to see his neurologist for this in a while.  He has really been symptom free for 10  years.  He did have some of that we need to do any additional blood work due to this.  At this time.    4.  Diarrhea.  He is a she started to feel better already.  I do not think we need to do any additional labs.  Encouraged bland diet and fluids.  If it persists, we will have him get some stool specimens.      Plan:      Procedures      1. Essential hypertension  CBC and differential    Comprehensive metabolic panel    Lipid panel   2. Mixed hyperlipidemia  CBC and differential    Comprehensive metabolic panel    Lipid panel   3. History of sarcoidosis     4. Diarrhea  CBC and differential   5.      Continue his current medications  6.  Continue working with diet and exercise.  7.  Let me know if he continues with the diarrhea and we will get stool specimens.    HIgh BMI Follow Up   Encouragement to Exercise     Quality Measure: Controlling Blood Pressure for patient with Hypertension.  Document follow-up if necessary.    The most frequent and serious complications of uncontrolled hypertension include coronary heart disease, congestive heart failure, stroke, ruptured aortic aneurysm, renal disease, and retinopathy. The increased risks of hypertension are present in individuals ranging from 56 to 26 years of age. For every 20 mmHg systolic or 10 mmHg diastolic increase in blood pressure, there is a doubling of mortality from both ischemic heart disease and stroke [employer] on Prevention, Detection, Evaluation, and Treatment of High Blood Pressure 2003). Better control of blood pressure has been shown to significantly reduce the probability that these undesirable and costly outcomes will occur. The relationship between the measure (control of hypertension) and the long-term clinical outcomes listed is well established. In clinical trials, antihypertensive therapy has been associated with reductions in stroke incidence (35-40 percent), myocardial infarction incidence (20-25 percent) and heart failure  incidence (>50 percent) (Joint National Committee on Prevention, Detection, Evaluation, and Treatment of High Blood Pressure 2003).    The [employer] (2007) recommends screening for high blood pressure in adults age 56 years and older. This is a grade A recommendation.     Joint National Committee on Prevention, Detection, Evaluation, and Treatment of High Blood Pressure (2003): Treating systolic blood pressure and diastolic blood pressure to targets that are <140/90 mmHg is associated with a decrease in cardiovascular disease complications.    Guideline  developer: [employer] for [employer]) of Funding: Undetermined  Measure Steward: [employer] for [employer]  Date Released:  April 2013    Override:Health Maintenance Plan for Controlling BP for Hypertension.

## 2014-09-20 NOTE — Progress Notes (Signed)
Have you seen any new outside specialists? "No"

## 2014-10-31 ENCOUNTER — Other Ambulatory Visit (INDEPENDENT_AMBULATORY_CARE_PROVIDER_SITE_OTHER): Payer: Self-pay

## 2014-10-31 MED ORDER — AMLODIPINE BESYLATE 10 MG PO TABS
ORAL_TABLET | ORAL | Status: DC
Start: 2014-10-31 — End: 2015-02-15

## 2014-11-08 ENCOUNTER — Encounter (INDEPENDENT_AMBULATORY_CARE_PROVIDER_SITE_OTHER): Payer: Self-pay | Admitting: Family Medicine

## 2014-11-08 ENCOUNTER — Ambulatory Visit (INDEPENDENT_AMBULATORY_CARE_PROVIDER_SITE_OTHER): Payer: BC Managed Care – PPO | Admitting: Family Medicine

## 2014-11-08 VITALS — BP 121/73 | HR 64 | Temp 98.1°F | Resp 18 | Wt 226.8 lb

## 2014-11-08 DIAGNOSIS — H6121 Impacted cerumen, right ear: Secondary | ICD-10-CM

## 2014-11-08 NOTE — Progress Notes (Signed)
Subjective:       Patient ID: Jerry Hickman is a 59 y.o. male.    HPI    The patient is here today today for probable swimmers ear.  This is on his right ear.  He has been at the beach.  His ear became very clogged and painful.  It was painful to the past few days as well. He has a history of wax build up.    The following portions of the patient's history were reviewed and updated as appropriate:  current medications, allergies, past family history, past medical history, past social history, past surgical history and problem list.      Review of Systems   Constitutional: Negative for fever, diaphoresis, activity change, fatigue and unexpected weight change.   HENT: Positive for hearing loss. Negative for congestion, ear discharge, ear pain, sore throat and voice change.    Eyes: Negative for visual disturbance.   Respiratory: Negative for cough and shortness of breath.    Cardiovascular: Negative for chest pain and palpitations.   Neurological: Negative for dizziness and headaches.           Objective:    Physical Exam   Constitutional: He appears well-developed and well-nourished. No distress.   HENT:   Head: Normocephalic and atraumatic.   Mouth/Throat: Oropharynx is clear and moist.   Tympanic membrane on the left is pearly and intact.  His tympanic membrane on the right is occluded by cerumen.  There is no tenderness or discharge.  There is no canal erythema or edema that is visible.   Cardiovascular: Normal rate, regular rhythm and normal heart sounds.    Pulmonary/Chest: No respiratory distress. He has no wheezes. He has no rales.   Skin: He is not diaphoretic.     Blood pressure 121/73, pulse 64, temperature 98.1 F (36.7 C), temperature source Oral, resp. rate 18, weight 102.876 kg (226 lb 12.8 oz), SpO2 97 %.        Assessment:       Cerumen impaction of the right ear.  The cerumen was irrigated out with warm water with the assistance of gentle curettage.  After removal, the canal was atraumatic  appearing.  There was no erythema or bleeding.  The tympanic membrane was pearly and intact.  There was resolution of symptoms.      Plan:      Procedures    Irrigate his ear as needed.  2.  The next time he starts feeling being full to start using earwax dissolving drops.

## 2014-11-08 NOTE — Progress Notes (Signed)
Have you seen any new outside specialists? "No"

## 2014-12-13 ENCOUNTER — Other Ambulatory Visit (INDEPENDENT_AMBULATORY_CARE_PROVIDER_SITE_OTHER): Payer: Self-pay

## 2014-12-13 MED ORDER — ATORVASTATIN CALCIUM 10 MG PO TABS
ORAL_TABLET | ORAL | Status: DC
Start: 2014-12-13 — End: 2014-12-30

## 2014-12-26 ENCOUNTER — Encounter (INDEPENDENT_AMBULATORY_CARE_PROVIDER_SITE_OTHER): Payer: Self-pay | Admitting: Family Medicine

## 2014-12-26 ENCOUNTER — Ambulatory Visit (INDEPENDENT_AMBULATORY_CARE_PROVIDER_SITE_OTHER): Payer: BC Managed Care – PPO | Admitting: Family Medicine

## 2014-12-26 VITALS — BP 131/88 | HR 62 | Temp 98.6°F | Resp 20 | Wt 230.0 lb

## 2014-12-26 DIAGNOSIS — K4090 Unilateral inguinal hernia, without obstruction or gangrene, not specified as recurrent: Secondary | ICD-10-CM

## 2014-12-26 DIAGNOSIS — Z23 Encounter for immunization: Secondary | ICD-10-CM

## 2014-12-26 DIAGNOSIS — E782 Mixed hyperlipidemia: Secondary | ICD-10-CM

## 2014-12-26 LAB — LIPID PANEL
Cholesterol / HDL Ratio: 3.2
Cholesterol: 220 mg/dL — ABNORMAL HIGH (ref 0–199)
HDL: 68 mg/dL (ref 40–9999)
LDL Calculated: 130 mg/dL — ABNORMAL HIGH (ref 0–99)
Triglycerides: 112 mg/dL (ref 34–149)
VLDL Calculated: 22 mg/dL (ref 10–40)

## 2014-12-26 LAB — HEMOLYSIS INDEX: Hemolysis Index: 12 (ref 0–18)

## 2014-12-26 NOTE — Progress Notes (Signed)
Subjective:       Patient ID: Jerry Hickman is a 59 y.o. male.    HPI  The patient is here due to having a hernia.      He has soreness in his left groin with sitting.  It is especially in a car.  He has noticed no bump.,  He does have an umbilical hernia.  It is not a bad pain, but just like a strain.  It has been present for 2 months.  It is getting a little worse.    He he has had no change in bowel or bladder habits.    He is also here for follow up of his hyperlipidemia.  He is doing well with this.  He is taking his medication regularly.  He is due for some labs.    The following portions of the patient's history were reviewed and updated as appropriate:  current medications, allergies, past family history, past medical history, past social history, past surgical history and problem list.      Review of Systems   Constitutional: Negative for activity change, appetite change and unexpected weight change.   Eyes: Negative for visual disturbance.   Respiratory: Negative for shortness of breath and stridor.    Gastrointestinal: Negative for abdominal pain, diarrhea, constipation and abdominal distention.   Genitourinary: Negative for difficulty urinating.   Neurological: Negative for dizziness.           Objective:    Physical Exam   Constitutional: He is oriented to person, place, and time. He appears well-developed and well-nourished. No distress.   HENT:   Head: Normocephalic and atraumatic.   Nose: Nose normal.   Mouth/Throat: Oropharynx is clear and moist. No oropharyngeal exudate.   Eyes: Conjunctivae and EOM are normal. Pupils are equal, round, and reactive to light.   Neck: Neck supple.   Cardiovascular: Normal rate, regular rhythm and normal heart sounds.  Exam reveals no gallop and no friction rub.    No murmur heard.  Pulmonary/Chest: Effort normal and breath sounds normal. No respiratory distress. He has no wheezes. He has no rales.   Abdominal: Soft. Bowel sounds are normal. He exhibits no  distension. There is no tenderness.   He has an easily reducible, nontender umbilical hernia   Genitourinary:   In the inguinal area I feel a possible small hernia on the left  There is some point tenderness in the area as well.     Musculoskeletal: Normal range of motion.   Neurological: He is alert and oriented to person, place, and time.   Skin: Skin is warm and dry. No rash noted. He is not diaphoretic.   Psychiatric: He has a normal mood and affect.         Blood pressure 131/88, pulse 62, temperature 98.6 F (37 C), temperature source Oral, resp. rate 20, weight 104.327 kg (230 lb), SpO2 98 %.    Assessment:       1.  Probable inginual hernia and umbilical hernia.  Due to the discomfort in the inguinal area.  I will have him follow-up with surgery.  I have given him the phone number of Dr. Tasia Catchings.    2.  Hyperlipidemia.  He is having no trouble with his medication.  I will check some blood work on him today and have him continue to work on a healthy lifestyle.    3.  Health maintenance.  I will give him a flu shot today.  Plan:      Procedures    1. Left inguinal hernia  Ambulatory referral to General Surgery   2. Need for prophylactic vaccination and inoculation against influenza  Flu vacc QUAD PRES FREE 3 YRS & GREATER   3. Mixed hyperlipidemia  Lipid panel

## 2014-12-26 NOTE — Progress Notes (Signed)
Verified prefilled vial influenza vaccine prior to administration.

## 2014-12-26 NOTE — Progress Notes (Signed)
Have you seen any new outside specialists? "No " pt aware due for flu vacc.    Patient presented to the office for office visit received Influenza injection in the left deltoid/arm.  No reaction was noted and patient left in good condition.

## 2014-12-30 ENCOUNTER — Other Ambulatory Visit (INDEPENDENT_AMBULATORY_CARE_PROVIDER_SITE_OTHER): Payer: Self-pay | Admitting: Family Medicine

## 2014-12-30 DIAGNOSIS — E782 Mixed hyperlipidemia: Secondary | ICD-10-CM

## 2014-12-30 MED ORDER — ATORVASTATIN CALCIUM 20 MG PO TABS
20.0000 mg | ORAL_TABLET | Freq: Every day | ORAL | Status: DC
Start: 2014-12-30 — End: 2015-08-04

## 2015-01-04 ENCOUNTER — Encounter (INDEPENDENT_AMBULATORY_CARE_PROVIDER_SITE_OTHER): Payer: Self-pay | Admitting: Family Medicine

## 2015-01-20 ENCOUNTER — Encounter (INDEPENDENT_AMBULATORY_CARE_PROVIDER_SITE_OTHER): Payer: Self-pay

## 2015-02-15 ENCOUNTER — Other Ambulatory Visit (INDEPENDENT_AMBULATORY_CARE_PROVIDER_SITE_OTHER): Payer: Self-pay | Admitting: Family Medicine

## 2015-03-23 ENCOUNTER — Ambulatory Visit (INDEPENDENT_AMBULATORY_CARE_PROVIDER_SITE_OTHER): Payer: BC Managed Care – PPO | Admitting: Family Medicine

## 2015-05-16 ENCOUNTER — Other Ambulatory Visit (INDEPENDENT_AMBULATORY_CARE_PROVIDER_SITE_OTHER): Payer: Self-pay | Admitting: Family Medicine

## 2015-05-24 ENCOUNTER — Other Ambulatory Visit (INDEPENDENT_AMBULATORY_CARE_PROVIDER_SITE_OTHER): Payer: Self-pay | Admitting: Family Medicine

## 2015-05-24 MED ORDER — AMLODIPINE BESYLATE 10 MG PO TABS
ORAL_TABLET | ORAL | Status: DC
Start: 2015-05-24 — End: 2015-07-28

## 2015-05-24 NOTE — Telephone Encounter (Signed)
.  Pt is calling in regards to rx refill   amLODIPine (NORVASC) 10 MG tablet. Pt can be reached at [phone].        [id number] [place], [place] - [address] AT [facility] [address]    [phone] (Phone)    [phone] (Fax)

## 2015-05-29 ENCOUNTER — Telehealth (INDEPENDENT_AMBULATORY_CARE_PROVIDER_SITE_OTHER): Payer: Self-pay | Admitting: Family Medicine

## 2015-05-29 NOTE — Telephone Encounter (Signed)
Patient would like amLODIPine (NORVASC) 10 MG tablet [facility] [address]. Please let patient know that it has been done.

## 2015-05-29 NOTE — Telephone Encounter (Signed)
This was filled on 05/16/15 # 90 then again 05/24/15 #90. I called the pharmacist and they are unsure why he is asking for more as they state he picked up the pills. I spoke to wife and she apologized as she states pt informed her incorrectly

## 2015-07-21 ENCOUNTER — Other Ambulatory Visit (INDEPENDENT_AMBULATORY_CARE_PROVIDER_SITE_OTHER): Payer: Self-pay

## 2015-07-21 DIAGNOSIS — K219 Gastro-esophageal reflux disease without esophagitis: Secondary | ICD-10-CM

## 2015-07-21 MED ORDER — PANTOPRAZOLE SODIUM 20 MG PO TBEC
20.0000 mg | DELAYED_RELEASE_TABLET | Freq: Every day | ORAL | Status: DC
Start: 2015-07-21 — End: 2015-12-30

## 2015-07-21 MED ORDER — AMLODIPINE BESYLATE 10 MG PO TABS
ORAL_TABLET | ORAL | Status: DC
Start: 2015-07-21 — End: 2015-07-28

## 2015-07-28 ENCOUNTER — Other Ambulatory Visit (INDEPENDENT_AMBULATORY_CARE_PROVIDER_SITE_OTHER): Payer: Self-pay | Admitting: Family Medicine

## 2015-08-04 ENCOUNTER — Other Ambulatory Visit (INDEPENDENT_AMBULATORY_CARE_PROVIDER_SITE_OTHER): Payer: Self-pay

## 2015-08-04 DIAGNOSIS — E782 Mixed hyperlipidemia: Secondary | ICD-10-CM

## 2015-08-04 MED ORDER — ATORVASTATIN CALCIUM 20 MG PO TABS
20.0000 mg | ORAL_TABLET | Freq: Every day | ORAL | Status: DC
Start: 2015-08-04 — End: 2015-08-30

## 2015-08-04 NOTE — Telephone Encounter (Signed)
Fax refill request from [facility]

## 2015-08-30 ENCOUNTER — Other Ambulatory Visit (INDEPENDENT_AMBULATORY_CARE_PROVIDER_SITE_OTHER): Payer: Self-pay | Admitting: Family Medicine

## 2015-08-30 DIAGNOSIS — E782 Mixed hyperlipidemia: Secondary | ICD-10-CM

## 2015-08-30 MED ORDER — ATORVASTATIN CALCIUM 20 MG PO TABS
20.0000 mg | ORAL_TABLET | Freq: Every day | ORAL | Status: DC
Start: 2015-08-30 — End: 2016-01-12

## 2015-08-30 NOTE — Telephone Encounter (Signed)
Good morning,    Pt requesting refill for atorvastatin (LIPITOR) 20 MG tablet. Insurance requesting it be filled for 90 days. Pharmacy confirmed as [facility] - [place], [place] - [address].    Thanks,  [employer]

## 2015-11-16 ENCOUNTER — Telehealth (INDEPENDENT_AMBULATORY_CARE_PROVIDER_SITE_OTHER): Payer: Self-pay

## 2015-11-16 ENCOUNTER — Institutional Professional Consult (permissible substitution) (INDEPENDENT_AMBULATORY_CARE_PROVIDER_SITE_OTHER): Payer: Self-pay

## 2015-11-16 ENCOUNTER — Ambulatory Visit (INDEPENDENT_AMBULATORY_CARE_PROVIDER_SITE_OTHER): Payer: No Typology Code available for payment source | Admitting: Family Medicine

## 2015-11-16 ENCOUNTER — Encounter (INDEPENDENT_AMBULATORY_CARE_PROVIDER_SITE_OTHER): Payer: Self-pay

## 2015-11-16 ENCOUNTER — Encounter (INDEPENDENT_AMBULATORY_CARE_PROVIDER_SITE_OTHER): Payer: Self-pay | Admitting: Family Medicine

## 2015-11-16 VITALS — BP 142/92 | HR 65 | Temp 98.1°F | Resp 18 | Ht 68.5 in | Wt 229.2 lb

## 2015-11-16 DIAGNOSIS — N529 Male erectile dysfunction, unspecified: Secondary | ICD-10-CM

## 2015-11-16 DIAGNOSIS — F439 Reaction to severe stress, unspecified: Secondary | ICD-10-CM

## 2015-11-16 DIAGNOSIS — R351 Nocturia: Secondary | ICD-10-CM

## 2015-11-16 DIAGNOSIS — E782 Mixed hyperlipidemia: Secondary | ICD-10-CM

## 2015-11-16 DIAGNOSIS — I10 Essential (primary) hypertension: Secondary | ICD-10-CM

## 2015-11-16 MED ORDER — VALSARTAN 80 MG PO TABS
80.0000 mg | ORAL_TABLET | Freq: Every day | ORAL | Status: DC
Start: 2015-11-16 — End: 2016-01-09

## 2015-11-16 NOTE — Progress Notes (Signed)
Subjective:      Patient ID: Jerry Hickman is a 60 y.o. male     No chief complaint on file.       HPI     The patient is here for follow up of his hypertension.  It is up a little but there is some marital stress currently.  He Has been exercising and trying to watch his diet.  The stress is relatively new over the past 5 days.  His blood pressures been up some.  He is taking his medication regularly.  He is only on the amlodipine 10 mg.    He is here for follow up of his elevated cholesterol.   He is having no problem with his medication.  He overall is feeling well physically.  He is having no trouble with a muscle aches or pains.    He is interested in doing some counselling.    In 2004 he went through a long period of not feeling well, and eventually being diagnosed with sarcoid.  The upshot of this is he has been going to [facility].  He has been on viagra which helps at times but not completely.  He does have some nocturia as well as up to twice a night.    The following portions of the patient's history were reviewed and updated as appropriate:  current medications, allergies, past family history, past medical history, past social history, past surgical history and problem list.      Review of Systems   Constitutional: Negative for activity change, appetite change and fatigue.   Eyes: Negative for visual disturbance.   Respiratory: Negative for cough, shortness of breath and wheezing.    Cardiovascular: Negative for chest pain and palpitations.   Gastrointestinal: Negative for abdominal pain.   Neurological: Negative for dizziness, weakness and headaches.          BP 142/92 mmHg  Pulse 65  Temp(Src) 98.1 F (36.7 C) (Oral)  Resp 18  Ht 1.74 m (5' 8.5")  Wt 103.964 kg (229 lb 3.2 oz)  BMI 34.34 kg/m2  SpO2 98%     Objective:     Physical Exam   Constitutional: He is oriented to person, place, and time. He appears well-developed and well-nourished. No distress.   HENT:   Head:  Normocephalic and atraumatic.   Right Ear: External ear normal.   Left Ear: External ear normal.   Nose: Nose normal.   Mouth/Throat: Oropharynx is clear and moist. No oropharyngeal exudate.   Eyes: Conjunctivae and EOM are normal. Pupils are equal, round, and reactive to light. Right eye exhibits no discharge. Left eye exhibits no discharge. No scleral icterus.   Neck: Neck supple. No JVD present. No thyromegaly present.   Cardiovascular: Normal rate, regular rhythm and normal heart sounds.  Exam reveals no gallop and no friction rub.    No murmur heard.  Pulmonary/Chest: Effort normal and breath sounds normal. No respiratory distress. He has no wheezes. He has no rales.   Abdominal: Bowel sounds are normal.   Musculoskeletal: Normal range of motion. He exhibits no edema or tenderness.   Lymphadenopathy:     He has no cervical adenopathy.   Neurological: He is alert and oriented to person, place, and time. No cranial nerve deficit.   Skin: Skin is warm and dry. No rash noted. He is not diaphoretic.   Psychiatric: He has a normal mood and affect.       Blood pressure 142/92, pulse  65, temperature 98.1 F (36.7 C), temperature source Oral, resp. rate 18, height 1.74 m (5' 8.5"), weight 103.964 kg (229 lb 3.2 oz), SpO2 98 %.    Assessment:     1.  Essential hypertension.  His blood pressures a little up.  He has been on amlodipine for quite a while.  He additionally is having some problems with ED.  The amlodipine may be contributing.  I will try changing him to Diovan to see if this helps with the ED.  I will start with a middle-of-the-road dose of 80 mg, but we may need to increase this.  He is to check his blood pressure at home regularly.    2.  Hyperlipidemia.  He is having no trouble with medication.  He is taking it regularly.  He is having no muscle aches or pains.  He is watching his diet and exercising.  I will have him continue the same.    3.  Erectile dysfunction.  He is going to see a group about this  today.  However, I have also given him the name of Dr. Dory Peru for evaluation for the erectile dysfunction.  I will also have him get a prostate-specific antigen due to having the increasing symptoms of ED and nocturia.    4.  Stress.  Her is some stress with the marriage.  His wife is seeing a Veterinary surgeon and he is interested in seeing a counselor for himself.  She is also amenable to doing marital counseling, which he is as well.  We now have any behavioral health specialist coming in 3 times a week here.  I will try to get him into that program.  I will also give him the name of another local counselor.    Currently, his insurance is not activated.  I will hold off on his labs today.  We did not do a physical today due to this as well.  I will come back in in 3 weeks.  He will check his blood pressure regularly and let me know if his going higher with change in the medication.      Plan:     1. Essential hypertension  valsartan (DIOVAN) 80 MG tablet    CBC and differential    Comprehensive metabolic panel    Lipid panel    POCT UA Dipstix (10)(Multi-Test)   2. Mixed hyperlipidemia  CBC and differential    Comprehensive metabolic panel    Lipid panel  Keep up with the diet and exercise  Monitor his pressures at home  Follow up with me in 3 weeks - call sooner if there are problems with the medication or BP   3. Erectile dysfunction, unspecified erectile dysfunction type  PSA    Urology Referral: Madlyn Frankel, MD [facility])   4. Stress  Ambulatory referral to Behavioral Health   5. Nocturia  PSA    Urology Referral: Madlyn Frankel, MD [facility]    POCT UA Dipstix (10)(Multi-Test)         Chrisandra Carota, MD

## 2015-11-16 NOTE — Telephone Encounter (Signed)
Patient contacted same day at request of PCP for brief initial meet and greet visit by phone.  Provided patient with information on available services.  Patient scheduled initial appointment with this therapist for 11/20/15 at 130 pm.

## 2015-11-16 NOTE — Telephone Encounter (Signed)
Contacted patient to introduce self and provide information on [facility] services.  Left message requesting call back.  Also, sent email to mychart.

## 2015-11-20 ENCOUNTER — Ambulatory Visit (INDEPENDENT_AMBULATORY_CARE_PROVIDER_SITE_OTHER): Payer: No Typology Code available for payment source

## 2015-11-20 DIAGNOSIS — F4321 Adjustment disorder with depressed mood: Secondary | ICD-10-CM

## 2015-11-20 NOTE — Progress Notes (Signed)
[facility] Integrated Intake Assessment     11/20/2015    Jerry Hickman, a 60 y.o., Caucasian male, presenting for initial evaluation visit.      Start: 130 pm  End:  230 pm    Referred by Dr. Iline Oven    Chief Complaint: Jerry Hickman presents today for an initial evaluation visit to establish individual psychotherapy.  Patient reports that his wife recently told him that she is unhappy with their marriage and that she doesn't love him anymore.  Patient reports feeling shocked and stunned.  He has been experiencing a lack of sleep, lack of appetite/eating, increased sadness, difficulty concentrating, little interest in doing things, and racing thoughts.  These symptoms began about 10 days ago when his wife made this revelation to him.  Patient denies active suicidal ideation, intent or plan, but does admit to feeling extremely hopeless.        Current Symptoms:(see/administer PHQ-9 and GAD-7)     [x]  Loss of interest or pleasure in activities (inc loss of libido)  [x]  Depressed mood/ feeling hopeless / increased crying spells   [x]  Sleep pattern disturbance (including nightmares)  [x]  Feeling tired/low energy/ fatigue  [x]  Change in appetite   [x]  Excessive guilt / feeling bad about self  [x]  Concentration/forgetfulness  []  Psychomotor retardation or agitation  []  Suicidal or self-harming ideation     []  Anxiety or Nervousness  []  Inability to stop/control excessive worry   []  Difficulty relaxing / restlessness   []  Increased irritability    []  Hallucinations   []  Excessive energy    [] ________________         Depression: Patient admits to increased feelings of sadness, hopelessness, loss of pleasure in activities, sleep disturbance, and appetite disturbance.  These symptoms are in direct relation to his wife telling him that she's unsure of herself and what she wants.       Anxiety: pt denied unusual or excessive anxiety.       Mania: Pt denied the presence of any manic symptoms.     OCD: Pt  denied obsessive, intrusive and persistent thoughts or compulsive, ritualistic acts.     Eating Disorders: Pt denied any symptoms of bingeing, purging or other indications of an eating disorder.    Psychosis: Pt denied presence of any hallucinations, delusions or other sx of psychotic process.     Physical Health Concerns  Pt reports he has a history of neurosarcoidosis, high blood pressure, and erectile dysfunction.  He reports that he has had a hip replacement and a knee replacement.    Current physical health medication includes Diovan 80 mg, Lipitor 20 mg, Protonix 20 mg.      History of Present Illness:  Patient reported concerns with above-mentioned symptoms over the last 10 days.  He denies a previous history of mental health symptoms.       Patient reported these symptoms have been increasingly worse.      Patient reported possible  precipitating event(s) include the following: Patient reports that his wife is feeling "lost" and is unsure if she loves him.  She is unsure what she wants.      Current Stressors:      []  Work/School related:       []  Financial      [x]  Family       []  Legal      []  Health       []  Other      Past Psychiatric  History:   Previous Mental HealthTreatment/diagnosis:None reported  Current/Previous Psychiatrist: None reported  Current/Previous Therapist: None reported  Previous hospitalizations: None reported  Previous suicide attempts: None  reported  Previous medications: None reported  Current OTC medications: None reported    Risk Assessment   Suicidal ideation: Patient admits to feeling very hopeless; has had thoughts that dying would feel better than this.  Suicidal intent:  None reported  Suicidal plan: None reported  History of attempt (s): None reported      Homicidal ideation:    Do you have thoughts of harming someone else? None reported  Do you have a plan to harm anyone else at this time:  None reported  Have you assaulted or threatened anyone recently? None  reported  Have you ever been in trouble because of your temper/violence? None reported  Does drinking/drugging ever lead you to become violent?None reported  Do you own a gun or a lethal weapon? None reported  Have you ever considered/planned harming yourself or others with a gun or other lethal weapon? None reported    Database administrator (if pt endorsed any SI/HI statements)     [x]  Assessed and explored patient's thoughts suicidal ideation, self-harm.  Not considered an active concern at this time     []  The patient is considered at moderate risk for suicide, but not to the degree that requires immediate inpatient hospitalization. Patient agreed to safety plan which includes calling 911 and/or going to ER if needed.       []  Patient is considered at high risk of harm to self/others.  Patient sent for further evaluation with emergency services.         Substance Abuse History:  Recreational drugs: None reported  Use of alcohol: Patient admits to occasional use of wine.  Use of caffeine: None reported  Tobacco use: None reported  Legal consequences of chemical use: None reported    C.A.G.E  Patient feels she ought to cut down on drinking and/or drug use: None reported  Patient has been annoyed by others criticizing her drinking or drug use: None reported  Patient has felt bad or guilty about drinking or drug JXB:JYNW reported  Patient has had a drink or used drugs as an eye opener first thing in the morning to steady nerves, get rid of a hangover or get the day started: None reported      Trauma  Pt reports being a survivor of abuse:None reported  Pt has experienced/ witnessed the following traumatic events  Reexperiencing: None reported      Brief Personal History:  Patient currently lives with wife of 1 years and their 49 year old son.  Pt is originally from [place]  Pt's parent's mother has passed away; he reportedly has a good relationship with this father.  Pt has three siblings.  Pt has three  children.  Functioning Relationships: Patient reports that he has a good relationship with his sisters, children, and his father.  He reports that his wife his is best friend.  He states that he has had the some close friends for many years.  Current daily activity:        Mental Status Evaluation:  General/Appearance:  [x] age appropriate    [] bearded    [x] casually dressed       [] defiant     [x] cooperative [] disheveled    [] older than stated age    [] overweight    [] piercings    [] tattooed    [] thin & gaunt looking    []   well dressed    [] younger than stated age     [x] good eye contact    [] avoidant eye contact    [] hesitant   [] Other:    Gait:    [x] normal gait    [] gait abnormality:   Behavior/Psychomotor:    [x] Normal   [] psychomotor agitation    [] psychomotor retardation    [] restless     [] fidgety     [] tics  [] rigidity  [] flaccid       [] akathesia    [] choreaoathetoid movt   [] Other:    Speech:      [x] Normal pitch     [x] normal volume    [] articulation error    [] delayed    [] increased latency of response     [] loud    [] pressured    [] profane     [] soft [] perseveration  [] Other:    Mood:  []  Normal     [] angry    [] anxious    [] constricted    [] decreased range     [x] depressed    [] dysthymic    [] euphoric    [] euthymic    [] irritable    [] labile     [x] sad   [] Other:    Affect:      [x] congruent with mood      [] full      [] constricted      [] guarded      [] flat      [] blunted      [] expansive   [] Other:    Thought Process /Attention /Concentration:      [x] Normal    []  blocked    [] circumstantial     [] concrete    [] flight of ideas     [x] goal directed    [] loose associations     [] tangential       [] distractable      [] inattentive       [x] associations intact [x]  abstract reasoning intact   Thought Content:     [] delusions    [] obsessions        []  absent of suicidal or homocidal ideation   [x] absent of psychotic symptoms  []  A/H     [] paranoia          [] obsessions      [] suicidal ideation       [] suicidal plan      [] suicidal intent      [x] passive suicidal ideation      [] homicidal ideation      [] homicidal plan      [] homicidal intent  [] Other:    Perception /Sensorium:         [x] alert     [] drowsy      [x] oriented to person    [x] oriented to place     [x] oriented to time     [x] oriented to situation          [x] does not appear to be reacting to internal or external stimuli today       [] appears to be reacting to internal or external stimuli  [] Other:    Language:  [x] age appropriate    []  naming okay   []  repetition    Fund of knowledge:  [x] age appropriate    [x]  adequate   [] in adequate []  above average   Cognition/Memory:        [x]  cognition grossly intact       []  cognition impaired due to:   []  immediate recall deficit  []  recent memory deficit  [] delayed memory deficit   [] MMSE:   []   MOCA:    Insight:        [x] age appropriate      [x] fair       [] good       [] limited   Judgment:        [x] age appropriate      [x] fair       [] good       [] limited          Assessment:  Pt is a 60 year old, Caucasian male from [place], [place]. Currently presents with symptoms of an Adjustment Disorder with Depressed Mood    - that appear to be consistent with a DSM-5 diagnosis of F43.21.    Identified precipitating factors:  Patient's wife appears to be experiencing stress.  She has told patient she is unsure if she loves him.  Identified maintaining factors:  Patient is continuing to go to work and participate in daily activities such as working out and golfing.  Identified protective factors:  Patient is committed to his family and desires to be present with them.  Patient is interested in participating in therapy to learn effective ways to manage his situation.            Plan:    Treatment Goals:                 [x]  Increase positive enjoyable/meaningful activities (behavioral activation)          [x]  Develop coping skills (e.g. deep breathing, relaxation)         [x]  Develop and practice  anxiety/depression/anger management skills        [x]  Practice sleep hygiene         []  Practice smoking cessation skills               [x]  Improve functioning and prevent recurrence or worsening of symptoms        []  Increase medication adherence            []  Other:       Recommendations:      [x]  Follow-up psychotherapy visits scheduled for 11/23/15 at 130 pm.      []  Pt not considred appropriate for psychotherapy at this time       []  Patient referred back to PCP to discuss trial psychotropic medication       []  Patient referred to outpatient psychiatry services          []  Patient referred for higher level of care (psychiatry, PHP, IOP, IPAC or emergency room for further evaluation)          []  Patient referred to Care Navigation to pursue longer-term therapy options       []  Other:           Wilfrid Lund, [facility]

## 2015-11-23 ENCOUNTER — Ambulatory Visit (INDEPENDENT_AMBULATORY_CARE_PROVIDER_SITE_OTHER): Payer: No Typology Code available for payment source

## 2015-11-23 DIAGNOSIS — F4321 Adjustment disorder with depressed mood: Secondary | ICD-10-CM

## 2015-11-23 NOTE — Progress Notes (Signed)
PROGRESS NOTES    Name:  Jerry Hickman 1955/10/24 Medical Records: [id number]      Time in: 130pm         Time out:  215 pm    SUBJECTIVE AND OBJECTIVE DATA:     Met with the patient and discussed the their social, emotional and behavioral functioning over the past 3 days.      SUBJECTIVE:  Pt reports sx have slightly improved since last visit as he is feeling less intense emotions.  He continues to experience symptoms of : sadness, confusion, and irrational thoughts.  Patient states that he believes his wife is experiencing depression or a midlife crisis that is impacting him greatly.  He feels at fault for what she's experiencing and is questioning his behaviors of the last 34 years.  Patient also reports that he is beginning to feel angry as it seems that his wife is not trying to improve their situation.          OBJECTIVE:   Patient arrives on time for individual session with this therapist.  He is dressed age appropriately and fully engages to therapeutic process.  He demonstrates good eye contact and presents with full affect. He continues to be saddened by his current situation with his wife and marriage, but was not tearful during presentation. He denied suicidal ideation/homicidal ideation/self-harming behaviors.  He denied ETOH or alcohol abuse/dependency.   Patient reports that his wife continues to feel confused about herself and is not communicating much.  He admits that he feels angry that she is not communicating as it makes him feel that she's not trying.  Patient states that it's difficult for him not to be able to fix things for his wife or in his marriage.          ASSESSMENT:     Patient continues to meet criteria for the F43.21 diagnoses.  He is applying the CBT therapeutic interventions with some success.  He appears to feel slightly less stressed and was open to discussion around communication, guilt, and control.  Patient illustrates some insight to understanding why his wife has made  specific comments about him.  He appears receptive to accepting responsibility for factors that illustrate his contribution to their conflict.  Patient is working toward giving his wife space and not internalizing the events as his fault.  He appears to have a history of "fixing" things and is having difficulty with not being able to fix his wife.  He appears to understand the importance of providing his wife with space and is working toward releasing irrational thoughts and beliefs about this situation.  Patient was provided a worksheet that will help him identify his triggers for anger and was also provided a worksheet on how to appropriately manage feelings of guilt.        PLAN:     Treatment Plan and Goals:    Pt advised and assisted to continue with the following recommendations:        []  Increase positive enjoyable/meaningful activities (behavioral activation)          [x]  Develop coping skills (e.g. deep breathing, relaxation)         [x]  Identify triggers and maintaining factors to current sx        [x]  Practice sleep hygiene         []  Practice smoking cessation skills               []  Improve functioning and prevent  recurrence or worsening of symptoms        []  Increase medication adherence                      []  Other:           ProgressTowards Goals   Outcome of today's visit:  Patient fully engaged to therapeutic process and appears to be making process with identifying irrational thoughts and beliefs.          [x]  Implement today's recommendations and follow up on 8/24/17at 130 pm.        []  No further visits scheduled at this time         []  other      Wilfrid Lund, [facility]

## 2015-11-30 ENCOUNTER — Ambulatory Visit (INDEPENDENT_AMBULATORY_CARE_PROVIDER_SITE_OTHER): Payer: No Typology Code available for payment source

## 2015-12-01 ENCOUNTER — Ambulatory Visit (INDEPENDENT_AMBULATORY_CARE_PROVIDER_SITE_OTHER): Payer: No Typology Code available for payment source

## 2015-12-01 DIAGNOSIS — F4321 Adjustment disorder with depressed mood: Secondary | ICD-10-CM

## 2015-12-01 NOTE — Progress Notes (Signed)
PROGRESS NOTES    Name:  Jerry Hickman [birth year] Medical Records: [id number]      Time in: 8 am         Time out:  9 am    SUBJECTIVE AND OBJECTIVE DATA:     Met with the patient and discussed the their social, emotional and behavioral functioning over the past week.      SUBJECTIVE:  Patient reports that he continues to be emotional, but is feeling it less intensely.  He admits that he is hurt and having difficulty understanding his wife.  He feels that she is not putting enough effort into working on her problems so that she can improve her well being.  He feels that he is continuing to give her space, but admits that he is unsure of how to live a life that is separate from her.  He states that he has been keeping himself busy by working out, golfing, going on interviews, and spending time with family and friends.  He continues to experience sadness, anger, and confusion.  He also reports that he has begun to feel a little resentment toward his wife for not working more proactively on her issues.     OBJECTIVE:   Patient arrives on time for individual session with this therapist.  He is dressed age appropriately and fully engages during session.  He demonstrates good eye contact and presents with full affect.  He is oriented x4.  His PHQ is at a 4.   He denied suicidal ideation/homicidal ideation/self-harming behaviors.  He denied ETOH or alcohol abuse/dependency.  He discussed his issues with his marriage and appears frustrated with it's current state.  He is calm during session and communicates clearly.  He appears to be less shocked about the occurrence with this wife and is beginning to accept that he cannot help her until she tells him what she needs help with.          ASSESSMENT:     Patient continues to meet criteria for adjustment disorder with depressed mood.  He states that he is coming to a place of acceptance that he cannot "fix" his wife's issues and that while he wants to support her, he has to give  her space.  Over the last week, he has been reflecting on his own feelings and has been giving his wife space.  He utilized the anger inventory to understand his triggers for anger and utilized coping skills such as golfing, working out at [employer] and talking to his sisters.  He also reflected on a guilt worksheet that he'd been provided during last session to recognize his pattern for blaming himself.  During session, patient identified that it has been difficult to adjust to this situation because it's new and it's something he's really ever experienced.  Today, he was provided a introduction to DBT skills to help manage intense emotions and situations.  Patient will benefit from utilizing mindfulness skills, emotional regulation skills, and distress tolerance skills, and interpersonal effectiveness skills.       PLAN:     Treatment Plan and Goals:    Pt advised and assisted to continue with the following recommendations:        []  Increase positive enjoyable/meaningful activities (behavioral activation)          [x]  Develop coping skills (e.g. deep breathing, relaxation)         [x]  Identify triggers and maintaining factors to current sx        []   Practice sleep hygiene         []  Practice smoking cessation skills               []  Improve functioning and prevent recurrence or worsening of symptoms        []  Increase medication adherence                      []  Other:           ProgressTowards Goals Patient is making process with understanding his emotions more clearly and utilizing positive coping skills to achieve emotional stability.  Outcome of today's visit:          [x]  Implement today's recommendations and follow up on 12/14/15 at 815 am.        []  No further visits scheduled at this time        []  other      Wilfrid Lund, [facility]

## 2015-12-07 ENCOUNTER — Ambulatory Visit (INDEPENDENT_AMBULATORY_CARE_PROVIDER_SITE_OTHER): Payer: Self-pay | Admitting: Family Medicine

## 2015-12-14 ENCOUNTER — Ambulatory Visit (INDEPENDENT_AMBULATORY_CARE_PROVIDER_SITE_OTHER): Payer: No Typology Code available for payment source

## 2015-12-14 DIAGNOSIS — F4321 Adjustment disorder with depressed mood: Secondary | ICD-10-CM

## 2015-12-14 NOTE — Progress Notes (Signed)
PROGRESS NOTES    Name:  Jerry Hickman 1955/08/11 Medical Records: [id number]      Time in: 815 am        Time out:  9 am    SUBJECTIVE AND OBJECTIVE DATA:     Met with the patient and discussed the their social, emotional and behavioral functioning over the past 2 weeks.      SUBJECTIVE:  Patient continues to report little interest or pleasure in doing things, depressed mood, fatigue, poor appetite, excessive guilt, trouble concentrating, and at times feels he would be better off dead.  Patient reports that he is experiencing fear, hurt, and anger in regards to his marital situation.  He wants to improve things in his marriage, but feels his wife is a barrier as she doesn't appear open to communication at this time.  Patient thinks it would be best to start couples therapy, but states that his wife isn't ready, which increases his stress and anger.      OBJECTIVE:   Patient arrives on time to individual session with this therapist.  He is dressed age appropriately and fully engages to therapeutic process.  He demonstrates good eye contact and presents with full affect.  He is oriented x4. He denied suicidal ideation/homicidal ideation/self-harming behaviors (reports that thoughts about being better off dead is more hopelessness regarding his marriage).  Patient's PHQ 9 score is 13.  He denied ETOH or alcohol abuse/dependency.   Patient believes that he continues to experience symptoms due to his wife's ambivalence about their marriage.  He states that he feels like he's "walking on eggshells" around her and has asked her to consider if she wants a separation.  This therapist empathized with patient's situation and encouraged him to focus on factors that he can change instead of factors that he cannot.  Patient appears to have difficulty with not being able to control the situation, which is something that he's accustomed to doing in his profession and at home.          ASSESSMENT:     He continues to meet criteria  for the F43.21 diagnosis.  Patient continues to report little interest or pleasure in doing things, depressed mood, fatigue, poor appetite, excessive guilt, trouble concentrating, and at times feels he would be better off dead.  Patient reports that he is experiencing fear, hurt, and anger in regards to his marital situation.  It appears that he is having difficulty accepting his wife's ambivalence, especially on their marriage.  It seems that this is the first time that patient has not controlled something in his family and he seems to be reacting with anger.  He reports that he is being compassionate toward his wife and at the same time, he states that it's already been 9 weeks and that they should be moving forward.  Patient was requested to keep an anger chart and to focus on behaviors that he'd like to improve, such as being less controlling.  Patient is applying therapeutic interventions with apparent success.            PLAN:     Treatment Plan and Goals:    Pt advised and assisted to continue with the following recommendations:        []  Increase positive enjoyable/meaningful activities (behavioral activation)         [x]  Develop coping skills (e.g. deep breathing, relaxation)         [x]  Identify triggers and maintaining factors to current sx        []   Practice sleep hygiene         []  Practice smoking cessation skills               [x]  Improve functioning and prevent recurrence or worsening of symptoms        []  Increase medication adherence                      []  Other:           ProgressTowards Goals Patient is making good progress with understanding the triggers for the emotions he's experiencing.    Outcome of today's visit:          [x]  Implement today's recommendations and follow up 12/25/15 at 815 am.        []  No further visits scheduled at this time         []  other      Wilfrid Lund, [facility]

## 2015-12-25 ENCOUNTER — Ambulatory Visit (INDEPENDENT_AMBULATORY_CARE_PROVIDER_SITE_OTHER): Payer: No Typology Code available for payment source

## 2015-12-27 ENCOUNTER — Ambulatory Visit (INDEPENDENT_AMBULATORY_CARE_PROVIDER_SITE_OTHER): Payer: No Typology Code available for payment source

## 2015-12-29 ENCOUNTER — Ambulatory Visit (INDEPENDENT_AMBULATORY_CARE_PROVIDER_SITE_OTHER): Payer: No Typology Code available for payment source

## 2015-12-29 DIAGNOSIS — F4321 Adjustment disorder with depressed mood: Secondary | ICD-10-CM

## 2015-12-29 NOTE — Progress Notes (Signed)
PROGRESS NOTES    Name:  Jerry Hickman Jun 06, 1955 Medical Records: [id number]      Time in: 2 pm         Time out:  3 pm    SUBJECTIVE AND OBJECTIVE DATA:     Met with the patient and discussed the their social, emotional and behavioral functioning over the past 2 weeks.      SUBJECTIVE:  Patient report little pleasure in doing things, depressed mood, sleep disturbance, feeling bad about himself, and thoughts he'd be better off dead.  Patient reports that he feels less intensity to his symptoms.  He admits that he continues to have hope that he and his wife will be able to repair their relationship.  He reports that he is giving her space, yet is trying to be supportive to her and their children as well.   OBJECTIVE:   Patient arrives on time for individual session with this therapist.  He is dressed age appropriately and fully engages to therapeutic session.  He demonstrates good eye contact and presents with full affect.  Patient presents in a calm manner.  His PHQ-9 score is a 6. He denied suicidal ideation/homicidal ideation/self-harming behaviors.  He denied ETOH or alcohol abuse/dependency.    Patient attributes improved symptoms to not allowing himself to ask certain questions to his wife, which seem to cause conflict; continuing to work out, and spending time with friends and family.  Patient has been encouraged to focus on his own needs and how to meet them.      ASSESSMENT:     .He continues to meet criteria for the adjustment disorder with depressed mood diagnosis.  Patient report little pleasure in doing things, depressed mood, sleep disturbance, feeling bad about himself, and thoughts he'd be better off dead.  Patient reports that he feels less intensity to his symptoms. He continues to have hope that he and his wife will repair their relationship.  He is encouraged to focus on his own needs and how to fulfill them.  Patient is also encouraged to be honest in his communication with his wife if living in  ambivalence is too much to manage for him.  Patient has improved with focusing on himself and accepting this place his wife and marriage are in.    He appears to be applying the therapeutic interventions with apparent success as he is feeling a reduction in symptoms.       PLAN:     Treatment Plan and Goals:    Pt advised and assisted to continue with the following recommendations:        [x]  Increase positive enjoyable/meaningful activities (behavioral activation)          [x]  Develop coping skills (e.g. deep breathing, relaxation)         [x]  Identify triggers and maintaining factors to current sx        []  Practice sleep hygiene         []  Practice smoking cessation skills               [x]  Improve functioning and prevent recurrence or worsening of symptoms        []  Increase medication adherence                      []  Other:           ProgressTowards Goals Patient is making good progress with accepting the things that he cannot change and identifying  manners to cope effectively.  Outcome of today's visit:          [x]  Implement today's recommendations and follow up in 2 weeks.        []  No further visits scheduled at this time         []  other      Wilfrid Lund, [facility]

## 2015-12-30 ENCOUNTER — Other Ambulatory Visit (INDEPENDENT_AMBULATORY_CARE_PROVIDER_SITE_OTHER): Payer: Self-pay | Admitting: Family Medicine

## 2015-12-30 DIAGNOSIS — K219 Gastro-esophageal reflux disease without esophagitis: Secondary | ICD-10-CM

## 2016-01-09 ENCOUNTER — Other Ambulatory Visit (INDEPENDENT_AMBULATORY_CARE_PROVIDER_SITE_OTHER): Payer: Self-pay

## 2016-01-09 DIAGNOSIS — I10 Essential (primary) hypertension: Secondary | ICD-10-CM

## 2016-01-09 MED ORDER — VALSARTAN 80 MG PO TABS
80.0000 mg | ORAL_TABLET | Freq: Every day | ORAL | 1 refills | Status: DC
Start: 2016-01-09 — End: 2016-01-30

## 2016-01-09 NOTE — Telephone Encounter (Signed)
Fax request [facility] - 90 day supply

## 2016-01-12 ENCOUNTER — Ambulatory Visit (FREE_STANDING_LABORATORY_FACILITY): Payer: No Typology Code available for payment source | Admitting: Family Medicine

## 2016-01-12 ENCOUNTER — Encounter (INDEPENDENT_AMBULATORY_CARE_PROVIDER_SITE_OTHER): Payer: Self-pay | Admitting: Family Medicine

## 2016-01-12 VITALS — BP 144/86 | HR 61 | Temp 98.1°F | Wt 217.0 lb

## 2016-01-12 DIAGNOSIS — I10 Essential (primary) hypertension: Secondary | ICD-10-CM

## 2016-01-12 DIAGNOSIS — K219 Gastro-esophageal reflux disease without esophagitis: Secondary | ICD-10-CM

## 2016-01-12 DIAGNOSIS — I499 Cardiac arrhythmia, unspecified: Secondary | ICD-10-CM

## 2016-01-12 DIAGNOSIS — E782 Mixed hyperlipidemia: Secondary | ICD-10-CM

## 2016-01-12 DIAGNOSIS — Z23 Encounter for immunization: Secondary | ICD-10-CM

## 2016-01-12 DIAGNOSIS — F439 Reaction to severe stress, unspecified: Secondary | ICD-10-CM

## 2016-01-12 LAB — CBC AND DIFFERENTIAL
Absolute NRBC: 0 10*3/uL
Basophils Absolute Automated: 0.02 10*3/uL (ref 0.00–0.20)
Basophils Automated: 0.3 %
Eosinophils Absolute Automated: 0.05 10*3/uL (ref 0.00–0.70)
Eosinophils Automated: 0.8 %
Hematocrit: 46 % (ref 42.0–52.0)
Hgb: 15.4 g/dL (ref 13.0–17.0)
Immature Granulocytes Absolute: 0.01 10*3/uL
Immature Granulocytes: 0.2 %
Lymphocytes Absolute Automated: 1.09 10*3/uL (ref 0.50–4.40)
Lymphocytes Automated: 17.4 %
MCH: 31.8 pg (ref 28.0–32.0)
MCHC: 33.5 g/dL (ref 32.0–36.0)
MCV: 94.8 fL (ref 80.0–100.0)
MPV: 11.1 fL (ref 9.4–12.3)
Monocytes Absolute Automated: 0.46 10*3/uL (ref 0.00–1.20)
Monocytes: 7.3 %
Neutrophils Absolute: 4.65 10*3/uL (ref 1.80–8.10)
Neutrophils: 74 %
Nucleated RBC: 0 /100 WBC (ref 0.0–1.0)
Platelets: 152 10*3/uL (ref 140–400)
RBC: 4.85 10*6/uL (ref 4.70–6.00)
RDW: 13 % (ref 12–15)
WBC: 6.28 10*3/uL (ref 3.50–10.80)

## 2016-01-12 LAB — COMPREHENSIVE METABOLIC PANEL
ALT: 23 U/L (ref 0–55)
AST (SGOT): 21 U/L (ref 5–34)
Albumin/Globulin Ratio: 1.3 (ref 0.9–2.2)
Albumin: 4 g/dL (ref 3.5–5.0)
Alkaline Phosphatase: 67 U/L (ref 38–106)
BUN: 17 mg/dL (ref 9.0–28.0)
Bilirubin, Total: 0.9 mg/dL (ref 0.1–1.2)
CO2: 29 mEq/L (ref 21–30)
Calcium: 9.1 mg/dL (ref 8.5–10.5)
Chloride: 105 mEq/L (ref 100–111)
Creatinine: 1.1 mg/dL (ref 0.5–1.5)
Globulin: 3 g/dL (ref 2.0–3.7)
Glucose: 112 mg/dL — ABNORMAL HIGH (ref 70–100)
Potassium: 4.5 mEq/L (ref 3.5–5.1)
Protein, Total: 7 g/dL (ref 6.0–8.3)
Sodium: 140 mEq/L (ref 135–146)

## 2016-01-12 LAB — TSH: TSH: 2.98 u[IU]/mL (ref 0.35–4.94)

## 2016-01-12 LAB — LIPID PANEL
Cholesterol / HDL Ratio: 3.3
Cholesterol: 234 mg/dL — ABNORMAL HIGH (ref 0–199)
HDL: 70 mg/dL (ref 40–9999)
LDL Calculated: 137 mg/dL — ABNORMAL HIGH (ref 0–99)
Triglycerides: 136 mg/dL (ref 34–149)
VLDL Calculated: 27 mg/dL (ref 10–40)

## 2016-01-12 LAB — GFR: EGFR: >60

## 2016-01-12 LAB — HEMOLYSIS INDEX: Hemolysis Index: 8 (ref 0–18)

## 2016-01-12 MED ORDER — PANTOPRAZOLE SODIUM 20 MG PO TBEC
DELAYED_RELEASE_TABLET | ORAL | 1 refills | Status: DC
Start: 2016-01-12 — End: 2016-07-18

## 2016-01-12 MED ORDER — ATORVASTATIN CALCIUM 20 MG PO TABS
20.0000 mg | ORAL_TABLET | Freq: Every day | ORAL | 0 refills | Status: DC
Start: 2016-01-12 — End: 2016-04-08

## 2016-01-12 NOTE — Progress Notes (Signed)
Have you seen any specialists/other providers since your last visit with us?    No      Limb alert protocol reviewed?      Yes     Patient presented to the office for Influenza vaccine administration.  Received injection in the Right deltoid/arm.  No reaction was noted and patient left in good condition.

## 2016-01-12 NOTE — Progress Notes (Signed)
Subjective:      Patient ID: Jerry Hickman is a 60 y.o. male     Chief Complaint   Patient presents with   . Annual Exam     fasting for labs   . Flu Vaccine        HPI   The patient is here today for follow up.    He is still having an emotional roller coaster.   He is concerned that they will be coming to separation or divorce.  He is doing counseling and his wife is doing counseling.  This is still a big concern for him.    He is taking his medication without problems  He is working on diet and exercise.His blood pressure is good at home.    He is taking his cholesterol medication and having no problems with the medication.    He would like to get a flu shot today as well.    The following portions of the patient's history were reviewed and updated as appropriate:  current medications, allergies, past family history, past medical history, past social history, past surgical history and problem list.      Review of Systems   Constitutional: Negative for fatigue.   HENT: Negative for nosebleeds and tinnitus.    Eyes: Negative for visual disturbance.   Respiratory: Negative for chest tightness and shortness of breath.    Cardiovascular: Negative for chest pain and palpitations.   Gastrointestinal: Negative for abdominal pain, constipation, diarrhea and vomiting.   Genitourinary: Negative for difficulty urinating.   Musculoskeletal: Positive for neck pain.   Neurological: Negative for dizziness, light-headedness and headaches.   Psychiatric/Behavioral: Negative for sleep disturbance.          BP 144/86 (BP Site: Right arm, Patient Position: Sitting, Cuff Size: Large)   Pulse 61   Temp 98.1 F (36.7 C) (Oral)   Wt 98.4 kg (217 lb)   BMI 32.51 kg/m      Objective:     Physical Exam   Constitutional: He is oriented to person, place, and time. He appears well-developed and well-nourished. No distress.   HENT:   Head: Normocephalic and atraumatic.   Right Ear: External ear normal.   Left Ear: External ear normal.    Nose: Nose normal.   Mouth/Throat: Oropharynx is clear and moist. No oropharyngeal exudate.   Eyes: Conjunctivae and EOM are normal. Pupils are equal, round, and reactive to light. Right eye exhibits no discharge. No scleral icterus.   Neck: Neck supple. No JVD present. No thyromegaly present.   Cardiovascular: Normal rate, regular rhythm and normal heart sounds.  Exam reveals no gallop and no friction rub.    No murmur heard.  Pulmonary/Chest: Effort normal and breath sounds normal. No respiratory distress. He has no wheezes. He has no rales.   Abdominal: Soft. Bowel sounds are normal. He exhibits no distension. There is no tenderness.   Musculoskeletal: Normal range of motion. He exhibits no edema or tenderness.   Lymphadenopathy:     He has no cervical adenopathy.   Neurological: He is alert and oriented to person, place, and time. No cranial nerve deficit.   Skin: Skin is warm and dry. No rash noted. He is not diaphoretic.   Psychiatric: He has a normal mood and affect.       Blood pressure 144/86, pulse 61, temperature 98.1 F (36.7 C), temperature source Oral, weight 98.4 kg (217 lb).    EKG: NSR, occasional PVC  Assessment:  1.  Anxiety/depression.  He does not feel depressed but feels hurt and angry.  He is not suicidal. He is reluctant to go back on medication just yet, but will consider it.  I recommended continue with counseling.  We discussed this at length.    2.  Essential hypertension.  His blood pressure was little borderline.  It is better at home.  I will leave him on the same medication him check his blood pressure twice a day.    3.  Mixed hyperlipidemia.  He is taking his medication without problems.  I will get blood work on him today as he had not been able come in after his last appointment.    4.  PVCs.  With the stress is under his family history.  I will have him get a 24-hour Holter monitor.  He is asymptomatic with this.    5.  Health maintenance.  I will give him a flu shot  today.      Plan:     1. Continue his current medications.  2.  Check his blood pressure twice today.  3.  Holter monitor.  4.  Complete blood count, complete metabolic panel, lipids, thyroid-stimulating hormone  5.  Flu shot.  6.  Follow-up with me in 3 weeks    Chrisandra Carota, MD

## 2016-01-25 ENCOUNTER — Ambulatory Visit (INDEPENDENT_AMBULATORY_CARE_PROVIDER_SITE_OTHER): Payer: No Typology Code available for payment source

## 2016-01-25 DIAGNOSIS — F4321 Adjustment disorder with depressed mood: Secondary | ICD-10-CM

## 2016-01-26 NOTE — Progress Notes (Signed)
PROGRESS NOTES    Name:  JAMARQUES PINEDO Mar 13, 1956 Medical Records: [id number]      Time in: 1 pm        Time out:  2 pm    SUBJECTIVE AND OBJECTIVE DATA:     Met with the patient and discussed the their social, emotional and behavioral functioning over the past 3 weeks.      SUBJECTIVE:  Patient reports depressed mood and trouble sleeping.  He feels some of his symptoms have improved such as hopelessness, guilt, and trouble concentrating.  Patient feels communication is problematic between him and his wife.  He believes that the lack of effective communication is not allowing them to move from the conflict that they've been experiencing for the several months.  Patient believes that most of his stress is connected to the situation with his wife.  Patient wants to improve the communication with his wife.  Patient's wife agrees that there are problems with effective communication, however, she also feels that their situation has been caused by more than miscommunication.    OBJECTIVE:   Patient and his wife arrive on time for family session.  They are dressed age appropriately and cooperate throughout session.  They demonstrate good eye contact and present with full affect.  Patient and wife appears to be in calm, engaging moods.  Patient's PHQ-9 score is a 2, which is a low indicator of depressive symptoms.  He denied suicidal ideation/homicidal ideation/self-harming behaviors.  He denied ETOH or alcohol abuse/dependency.    Patient and his wife agree that their communication has problems.  Patient's wife admits that she often shuts down during discussions with her husband.  Patient feels that their marriage might be in a different place if his wife was more consistent with expressing her thoughts and feelings.  This therapist introduced the DEAR technique to help family with their effective communication.  Patient and wife appear receptive of intervention and both were able to role play practicing this  technique.              ASSESSMENT:     .He continues to meet criteria for the adjustment disorder with depressed mood diagnosis.  Patient reports depressed mood and trouble sleeping.  He feels some of his symptoms have improved such as hopelessness, guilt, and trouble concentrating.  Patient feels communication is problematic between him and his wife.  He believes that the lack of effective communication is not allowing them to move from the conflict that they've been experiencing for the several months.  Patient believes that most of his stress is connected to the situation with his wife.  This therapist introduced the DEAR technique to help family with their effective communication.  Patient and wife appear receptive of intervention and both were able to role play practicing this technique.  He  Is applying the therapeutic interventions with apparent success as he is feeling a relief in some symptoms.      PLAN:     Treatment Plan and Goals:    Pt advised and assisted to continue with the following recommendations:        []  Increase positive enjoyable/meaningful activities (behavioral activation)          [x]  Develop coping skills (e.g. deep breathing, relaxation)         []  Identify triggers and maintaining factors to current sx        []  Practice sleep hygiene         []  Practice smoking  cessation skills               []  Improve functioning and prevent recurrence or worsening of symptoms        []  Increase medication adherence                      []  Other:           ProgressTowards Goals   Patient illustrates some insight to underlying factors impacting his marriage.  Outcome of today's visit:          [x]  Implement today's recommendations and follow up within a month.        []  No further visits scheduled at this time        []  other      Wilfrid Lund, [facility]

## 2016-01-30 ENCOUNTER — Other Ambulatory Visit (INDEPENDENT_AMBULATORY_CARE_PROVIDER_SITE_OTHER): Payer: Self-pay | Admitting: Family Medicine

## 2016-01-30 DIAGNOSIS — I10 Essential (primary) hypertension: Secondary | ICD-10-CM

## 2016-01-30 MED ORDER — VALSARTAN 80 MG PO TABS
80.0000 mg | ORAL_TABLET | Freq: Every day | ORAL | 1 refills | Status: DC
Start: 2016-01-30 — End: 2016-07-29

## 2016-02-02 ENCOUNTER — Ambulatory Visit (INDEPENDENT_AMBULATORY_CARE_PROVIDER_SITE_OTHER): Payer: No Typology Code available for payment source | Admitting: Family Medicine

## 2016-02-05 ENCOUNTER — Ambulatory Visit (INDEPENDENT_AMBULATORY_CARE_PROVIDER_SITE_OTHER): Payer: No Typology Code available for payment source

## 2016-02-05 DIAGNOSIS — F4321 Adjustment disorder with depressed mood: Secondary | ICD-10-CM

## 2016-02-05 NOTE — Progress Notes (Signed)
Name:  Jerry Hickman [birth year] Medical Records: [id number]    Time in: 11 am         Time out:  12 pm    SUBJECTIVE AND OBJECTIVE DATA:     Met with the patient and discussed the their social, emotional and behavioral functioning over the past 2 weeks.      SUBJECTIVE:  Patient reports improved mood, pleasure in activities, and less hopelessness.  He continues to feel confused and agitated by the state of his marriage.  Patient desires to make his relationship work, but feels that his wife has fallen out of love with him and will not tell him that she wants a divorce.  Despite her feelings, patient wants the marriage to work and is hurt by her feelings. Patient wants to understand how to make better decisions for their marriage.  OBJECTIVE:   Patient arrives on time for individual session with this therapist.  He is dressed age appropriately and cooperates throughout session.  He demonstrates good eye contact and presents with full affect.  He presents with a calm, engaging demeanor.  He did not complete at PHQ-9 or GAD-7. He denied suicidal ideation/homicidal ideation/self-harming behaviors.  He denied ETOH or alcohol abuse/dependency.    He attributes improved mood to getting a new job offer, taking time for himself, and good support from his family.  He admits that he continues to feel hurt by his wife's uncertainty with their marriage.  He admits that his hurt sometimes leads to agitation and anger.  He wants his marriage to work, but fears his wife is out of love with him, but is hesitant to tell him that she wants a divorce.  Patient is encouraged to express his feelings in the moment with this wife as it appears that he is becoming more resentful of her when he bottles his emotions.      ASSESSMENT:     .He continues to meet criteria for the adjustment disorder with depressed mood. Patient reports improved mood, pleasure in activities, and less hopelessness.  He continues to feel confused and agitated by the  state of his marriage.   He attributes improved mood to getting a new job offer, taking time for himself, and good support from his family.  He admits that he continues to feel hurt by his wife's uncertainty with their marriage.  He admits that his hurt sometimes leads to agitation and anger.  He wants his marriage to work, but fears his wife is out of love with him, but is hesitant to tell him that she wants a divorce.  Patient is encouraged to express his feelings in the moment with this wife as it appears that he is becoming more resentful of her when he bottles his emotions.    He is applying the therapeutic interventions with apparent success as he feels less intense symptoms.    PLAN:     Treatment Plan and Goals:    Pt advised and assisted to continue with the following recommendations:        []  Increase positive enjoyable/meaningful activities (behavioral activation)          [x]  Develop coping skills (e.g. deep breathing, relaxation)         []  Identify triggers and maintaining factors to current sx        []  Practice sleep hygiene         []  Practice smoking cessation skills               []   Increase medication adherence                      []  Other:         ProgressTowards Goals Patient continues to make good progress with identifying triggers for symptoms.  Outcome of today's visit:          [x]  Implement today's recommendations and follow up as needed.        []  No further visits scheduled at this time         []  other    Victorya Hillman [facility]

## 2016-04-08 ENCOUNTER — Other Ambulatory Visit (INDEPENDENT_AMBULATORY_CARE_PROVIDER_SITE_OTHER): Payer: Self-pay | Admitting: Family Medicine

## 2016-04-08 DIAGNOSIS — E782 Mixed hyperlipidemia: Secondary | ICD-10-CM

## 2016-05-08 ENCOUNTER — Encounter (INDEPENDENT_AMBULATORY_CARE_PROVIDER_SITE_OTHER): Payer: Self-pay

## 2016-05-08 NOTE — Progress Notes (Signed)
Discharge Summary    Name:  Jerry Hickman [birth year] Medical Records: [id number]        Referral and Discharge Details     . Reason for Referral: Patient reports that his wife recently told him that she is unhappy with their marriage and that she doesn't love him anymore.  Patient reports feeling shocked and stunned.  He has been experiencing a lack of sleep, lack of appetite/eating, increased sadness, difficulty concentrating, little interest in doing things, and racing thoughts.  These symptoms began about 10 days ago when his wife made this revelation to him.  Patient denies active suicidal ideation, intent or plan, but does admit to feeling extremely hopeless.     . Treatment Diagnosis:  F43.21  . Treatment start date:  11/20/15  . Discharge date:  05/08/2016  . Number of sessions:  7      Treatment goals, Interventions and Progress     Identified treatment goals and main interventions used:  Patient participated in solution-focused, individual therapy using CBT and mindfulness techniques to address his treatment goals of:  "desires to be present with family and is interested learning effective ways to manage his situation.    Progress towards treatment goals  Patient met his goals for treatment as he decreased his symptoms of hopelessness and depression.  He experienced less guilt and began to focus on factors he could control instead of ruminating on aspects he couldn't.      Disposition at Discharge     Reason for discharge:  Patient decreased his symptoms and began to focus on plans to improve his situation.    Psychiatric Medications at Discharge  . N/A     List of referrals and recommendations:   . Patient is recommended to continue with coping skills that appear to be helpful.     Discharge Plan   . Patient is advised to continue with current medications and follow up with primary care provider as needed  . Patient is encouraged to return to treatment if symptoms re-occur          Wilfrid Lund,  [facility]

## 2016-07-07 ENCOUNTER — Other Ambulatory Visit (INDEPENDENT_AMBULATORY_CARE_PROVIDER_SITE_OTHER): Payer: Self-pay | Admitting: Family Medicine

## 2016-07-07 DIAGNOSIS — E782 Mixed hyperlipidemia: Secondary | ICD-10-CM

## 2016-07-08 ENCOUNTER — Telehealth (INDEPENDENT_AMBULATORY_CARE_PROVIDER_SITE_OTHER): Payer: Self-pay

## 2016-07-08 NOTE — Telephone Encounter (Signed)
The [employer] Coordinator reached out to the patient to see if we could be of any assistance in managing their health care needs. The patient has been doing well in managing his health, I gave him information on the well aware program and the [place] eap program.     Felipa Emory, LPN   [facility]

## 2016-07-18 ENCOUNTER — Other Ambulatory Visit (INDEPENDENT_AMBULATORY_CARE_PROVIDER_SITE_OTHER): Payer: Self-pay | Admitting: Family Medicine

## 2016-07-18 DIAGNOSIS — K219 Gastro-esophageal reflux disease without esophagitis: Secondary | ICD-10-CM

## 2016-07-29 ENCOUNTER — Other Ambulatory Visit (INDEPENDENT_AMBULATORY_CARE_PROVIDER_SITE_OTHER): Payer: Self-pay | Admitting: Family Medicine

## 2016-07-29 DIAGNOSIS — I10 Essential (primary) hypertension: Secondary | ICD-10-CM

## 2016-10-03 ENCOUNTER — Other Ambulatory Visit (INDEPENDENT_AMBULATORY_CARE_PROVIDER_SITE_OTHER): Payer: Self-pay | Admitting: Family Medicine

## 2016-10-03 DIAGNOSIS — E782 Mixed hyperlipidemia: Secondary | ICD-10-CM

## 2016-10-03 NOTE — Telephone Encounter (Signed)
Please let the patient know they are due to be seen

## 2016-10-03 NOTE — Telephone Encounter (Signed)
Let him know he is due to be seen

## 2016-11-08 ENCOUNTER — Encounter (INDEPENDENT_AMBULATORY_CARE_PROVIDER_SITE_OTHER): Payer: Self-pay

## 2016-12-05 ENCOUNTER — Telehealth (INDEPENDENT_AMBULATORY_CARE_PROVIDER_SITE_OTHER): Payer: Self-pay | Admitting: Family Medicine

## 2016-12-05 NOTE — Telephone Encounter (Signed)
Pt will call back to schedule a f/u apt.

## 2016-12-05 NOTE — Telephone Encounter (Signed)
LM to have patient called the office in regards to the valsartan recall.    Please have patient call their pharmacy to see if their batch have been effected. Ask patient to call us back with status.

## 2016-12-05 NOTE — Telephone Encounter (Signed)
Patient informed about the recall and will call back after speaking to pharmacy.

## 2016-12-10 NOTE — Telephone Encounter (Signed)
FYI

## 2017-01-05 ENCOUNTER — Other Ambulatory Visit (INDEPENDENT_AMBULATORY_CARE_PROVIDER_SITE_OTHER): Payer: Self-pay | Admitting: Family Medicine

## 2017-01-05 DIAGNOSIS — E782 Mixed hyperlipidemia: Secondary | ICD-10-CM

## 2017-01-05 NOTE — Telephone Encounter (Signed)
Please let the patient know they are due to be seen

## 2017-01-05 NOTE — Telephone Encounter (Signed)
Let him know he is due to be seen

## 2017-01-06 NOTE — Telephone Encounter (Signed)
Called and left a message.

## 2017-01-06 NOTE — Telephone Encounter (Signed)
Pt advised he was due to be seen. He said he will be checking his schedule and calling to schedule appt.

## 2017-01-16 ENCOUNTER — Other Ambulatory Visit (INDEPENDENT_AMBULATORY_CARE_PROVIDER_SITE_OTHER): Payer: Self-pay | Admitting: Family Medicine

## 2017-01-16 DIAGNOSIS — K219 Gastro-esophageal reflux disease without esophagitis: Secondary | ICD-10-CM

## 2017-01-20 ENCOUNTER — Other Ambulatory Visit (INDEPENDENT_AMBULATORY_CARE_PROVIDER_SITE_OTHER): Payer: Self-pay | Admitting: Family Medicine

## 2017-01-20 DIAGNOSIS — I10 Essential (primary) hypertension: Secondary | ICD-10-CM

## 2017-01-20 NOTE — Telephone Encounter (Signed)
Let him know he is due to be seen

## 2017-01-20 NOTE — Telephone Encounter (Signed)
Please let the patient know they are due to be seen

## 2017-01-20 NOTE — Telephone Encounter (Signed)
Patient aware. Scheduled for 02/04/17

## 2017-02-04 ENCOUNTER — Ambulatory Visit (INDEPENDENT_AMBULATORY_CARE_PROVIDER_SITE_OTHER): Payer: No Typology Code available for payment source | Admitting: Family Medicine

## 2017-04-10 ENCOUNTER — Other Ambulatory Visit (INDEPENDENT_AMBULATORY_CARE_PROVIDER_SITE_OTHER): Payer: Self-pay | Admitting: Family Medicine

## 2017-04-10 DIAGNOSIS — E782 Mixed hyperlipidemia: Secondary | ICD-10-CM

## 2017-04-19 ENCOUNTER — Other Ambulatory Visit (INDEPENDENT_AMBULATORY_CARE_PROVIDER_SITE_OTHER): Payer: Self-pay | Admitting: Family Medicine

## 2017-04-19 DIAGNOSIS — K219 Gastro-esophageal reflux disease without esophagitis: Secondary | ICD-10-CM

## 2017-04-19 NOTE — Telephone Encounter (Signed)
Let him know I sent in a 30 day supply   He is overdue for an appointment

## 2017-04-19 NOTE — Telephone Encounter (Signed)
Please let the patient know they are due to be seen

## 2017-04-19 NOTE — Telephone Encounter (Signed)
I believe this patient sees you.  Can you please take care of this?  Thank you.

## 2017-04-21 NOTE — Telephone Encounter (Signed)
Called and left a message.

## 2017-04-22 ENCOUNTER — Other Ambulatory Visit (INDEPENDENT_AMBULATORY_CARE_PROVIDER_SITE_OTHER): Payer: Self-pay | Admitting: Family Medicine

## 2017-04-22 DIAGNOSIS — I10 Essential (primary) hypertension: Secondary | ICD-10-CM

## 2017-04-22 NOTE — Telephone Encounter (Signed)
Please let the patient know they are due to be seen

## 2017-05-22 ENCOUNTER — Other Ambulatory Visit (INDEPENDENT_AMBULATORY_CARE_PROVIDER_SITE_OTHER): Payer: Self-pay | Admitting: Family Medicine

## 2017-05-22 DIAGNOSIS — K219 Gastro-esophageal reflux disease without esophagitis: Secondary | ICD-10-CM

## 2017-05-22 DIAGNOSIS — I10 Essential (primary) hypertension: Secondary | ICD-10-CM

## 2017-05-22 NOTE — Telephone Encounter (Signed)
Please let him know he is overdue to be seen.  He needs to make an appointment

## 2017-05-22 NOTE — Telephone Encounter (Signed)
Please let the patient know they are due to be seen

## 2017-05-23 NOTE — Telephone Encounter (Signed)
Left vm for pt.  Pls schedule f/u apt.

## 2017-05-26 NOTE — Telephone Encounter (Signed)
Left message to c/b.

## 2017-05-27 ENCOUNTER — Encounter (INDEPENDENT_AMBULATORY_CARE_PROVIDER_SITE_OTHER): Payer: Self-pay

## 2017-05-27 NOTE — Telephone Encounter (Signed)
Left message, email and appointment reminder letter mailed to patient.

## 2017-06-27 ENCOUNTER — Other Ambulatory Visit (INDEPENDENT_AMBULATORY_CARE_PROVIDER_SITE_OTHER): Payer: Self-pay | Admitting: Family Medicine

## 2017-06-27 DIAGNOSIS — K219 Gastro-esophageal reflux disease without esophagitis: Secondary | ICD-10-CM

## 2017-06-27 DIAGNOSIS — I10 Essential (primary) hypertension: Secondary | ICD-10-CM

## 2017-07-08 ENCOUNTER — Other Ambulatory Visit (INDEPENDENT_AMBULATORY_CARE_PROVIDER_SITE_OTHER): Payer: Self-pay | Admitting: Family Medicine

## 2017-07-08 DIAGNOSIS — E782 Mixed hyperlipidemia: Secondary | ICD-10-CM

## 2017-07-21 ENCOUNTER — Other Ambulatory Visit (INDEPENDENT_AMBULATORY_CARE_PROVIDER_SITE_OTHER): Payer: Self-pay | Admitting: Family Medicine

## 2017-07-21 DIAGNOSIS — K219 Gastro-esophageal reflux disease without esophagitis: Secondary | ICD-10-CM

## 2017-07-21 MED ORDER — PANTOPRAZOLE SODIUM 20 MG PO TBEC
20.0000 mg | DELAYED_RELEASE_TABLET | Freq: Every day | ORAL | 0 refills | Status: DC
Start: 2017-07-21 — End: 2017-08-15

## 2017-07-21 NOTE — Telephone Encounter (Signed)
[facility] # [id number].  90 day supply requested.

## 2017-07-22 ENCOUNTER — Telehealth (INDEPENDENT_AMBULATORY_CARE_PROVIDER_SITE_OTHER): Payer: Self-pay | Admitting: Family Medicine

## 2017-07-22 NOTE — Telephone Encounter (Signed)
[facility] [phone] requests 90 day supply of Pantoprazole 20 mg tablet.

## 2017-07-22 NOTE — Telephone Encounter (Signed)
Pt is scheduled for OV on 08/11/17 at 17:00.

## 2017-07-22 NOTE — Telephone Encounter (Signed)
Let him know he will need to be seen prior to further refills.

## 2017-07-24 ENCOUNTER — Other Ambulatory Visit (INDEPENDENT_AMBULATORY_CARE_PROVIDER_SITE_OTHER): Payer: Self-pay | Admitting: Family Medicine

## 2017-07-24 DIAGNOSIS — I10 Essential (primary) hypertension: Secondary | ICD-10-CM

## 2017-07-24 MED ORDER — VALSARTAN 80 MG PO TABS
80.0000 mg | ORAL_TABLET | Freq: Every day | ORAL | 0 refills | Status: DC
Start: 2017-07-24 — End: 2017-07-29

## 2017-07-24 NOTE — Telephone Encounter (Signed)
[facility] [phone].  90 day supply is required for insurance.

## 2017-07-29 ENCOUNTER — Telehealth (INDEPENDENT_AMBULATORY_CARE_PROVIDER_SITE_OTHER): Payer: Self-pay | Admitting: Family Medicine

## 2017-07-29 ENCOUNTER — Other Ambulatory Visit (INDEPENDENT_AMBULATORY_CARE_PROVIDER_SITE_OTHER): Payer: Self-pay | Admitting: Family Medicine

## 2017-07-29 DIAGNOSIS — I10 Essential (primary) hypertension: Secondary | ICD-10-CM

## 2017-07-29 MED ORDER — VALSARTAN 80 MG PO TABS
80.0000 mg | ORAL_TABLET | Freq: Every day | ORAL | 0 refills | Status: DC
Start: 2017-07-29 — End: 2017-08-15

## 2017-07-29 NOTE — Telephone Encounter (Signed)
[facility] # [phone].  Insurance requests a 90 day supply.

## 2017-07-29 NOTE — Telephone Encounter (Signed)
[id number] [place], [place] - [id number] [facility] [address] [phone] (Phone)  [phone] (Fax)     valsartan (DIOVAN) 80 MG tablet    Patients pharmacy above is requesting a 90 day supply of the above mentioned medication. Please contact the pharmacy and advise.

## 2017-07-29 NOTE — Telephone Encounter (Signed)
Opened in error, duplicate.

## 2017-08-11 ENCOUNTER — Ambulatory Visit (INDEPENDENT_AMBULATORY_CARE_PROVIDER_SITE_OTHER): Payer: No Typology Code available for payment source | Admitting: Family Medicine

## 2017-08-15 ENCOUNTER — Ambulatory Visit (INDEPENDENT_AMBULATORY_CARE_PROVIDER_SITE_OTHER): Payer: No Typology Code available for payment source | Admitting: Family Medicine

## 2017-08-15 ENCOUNTER — Encounter (INDEPENDENT_AMBULATORY_CARE_PROVIDER_SITE_OTHER): Payer: Self-pay | Admitting: Family Medicine

## 2017-08-15 VITALS — BP 129/80 | HR 67 | Temp 97.6°F | Wt 228.4 lb

## 2017-08-15 DIAGNOSIS — I10 Essential (primary) hypertension: Secondary | ICD-10-CM

## 2017-08-15 DIAGNOSIS — E782 Mixed hyperlipidemia: Secondary | ICD-10-CM

## 2017-08-15 DIAGNOSIS — Z1159 Encounter for screening for other viral diseases: Secondary | ICD-10-CM

## 2017-08-15 DIAGNOSIS — K219 Gastro-esophageal reflux disease without esophagitis: Secondary | ICD-10-CM

## 2017-08-15 LAB — CBC AND DIFFERENTIAL
Absolute NRBC: 0 10*3/uL (ref 0.00–0.00)
Basophils Absolute Automated: 0.02 10*3/uL (ref 0.00–0.08)
Basophils Automated: 0.4 %
Eosinophils Absolute Automated: 0.09 10*3/uL (ref 0.00–0.44)
Eosinophils Automated: 1.8 %
Hematocrit: 42.6 % (ref 37.6–49.6)
Hgb: 14.7 g/dL (ref 12.5–17.1)
Immature Granulocytes Absolute: 0.02 10*3/uL (ref 0.00–0.07)
Immature Granulocytes: 0.4 %
Lymphocytes Absolute Automated: 1.64 10*3/uL (ref 0.42–3.22)
Lymphocytes Automated: 32.4 %
MCH: 32.3 pg (ref 25.1–33.5)
MCHC: 34.5 g/dL (ref 31.5–35.8)
MCV: 93.6 fL (ref 78.0–96.0)
MPV: 10.3 fL (ref 8.9–12.5)
Monocytes Absolute Automated: 0.47 10*3/uL (ref 0.21–0.85)
Monocytes: 9.3 %
Neutrophils Absolute: 2.82 10*3/uL (ref 1.10–6.33)
Neutrophils: 55.7 %
Nucleated RBC: 0 /100 WBC (ref 0.0–0.0)
Platelets: 176 10*3/uL (ref 142–346)
RBC: 4.55 10*6/uL (ref 4.20–5.90)
RDW: 12 % (ref 11–15)
WBC: 5.06 10*3/uL (ref 3.10–9.50)

## 2017-08-15 MED ORDER — VALSARTAN 80 MG PO TABS
80.0000 mg | ORAL_TABLET | Freq: Every day | ORAL | 3 refills | Status: DC
Start: 2017-08-15 — End: 2018-11-19

## 2017-08-15 MED ORDER — ATORVASTATIN CALCIUM 20 MG PO TABS
20.0000 mg | ORAL_TABLET | Freq: Every day | ORAL | 3 refills | Status: DC
Start: 2017-08-15 — End: 2018-10-30

## 2017-08-15 MED ORDER — PANTOPRAZOLE SODIUM 20 MG PO TBEC
20.00 mg | DELAYED_RELEASE_TABLET | Freq: Every day | ORAL | 3 refills | Status: DC
Start: 2017-08-15 — End: 2018-07-12

## 2017-08-15 NOTE — Progress Notes (Signed)
Have you seen any specialists/other providers since your last visit with [place]?    No    Arm preference verified?   Yes    The patient is due for shingles vaccine, hepatitis c screening, colonoscopy, [facility] care plan letter.

## 2017-08-15 NOTE — Progress Notes (Signed)
Subjective:      Patient ID: Jerry Hickman is a 62 y.o. male.    Chief Complaint:  Chief Complaint   Patient presents with   . Medication Refill     Protonix 20 mg tablet. Pt would like refills on all medications, 90 day refills pls.          HPI:  HPI     Patient is here today for follow-up of his hypertension.  He is not checking this at home.   He is exercising a lot.   He is walking 2 miles a day (or stairmaster or elliptical), and weight lifting    His diet is good.      He is here today for follow-up of his hyperlipidemia.  He is having no problems with the medications.     He is here today for follow-up of his reflux.  He has been out of his protonix for a few weeks.   His diet is pretty bland.   Things are more settled so he is not having as much stress.   He is doing a lot of travelling and eating out.  He did have a bad episode of reflux after eating BBQ.      Problem List:  Patient Active Problem List   Diagnosis   . History of sarcoidosis   . Essential hypertension, benign   . Mixed hyperlipidemia   . ED (erectile dysfunction)   . GERD (gastroesophageal reflux disease)   . Localized osteoarthrosis not specified whether primary or secondary, pelvic region and thigh       Current Medications:  Current Outpatient Prescriptions   Medication Sig Dispense Refill   . atorvastatin (LIPITOR) 20 MG tablet TAKE 1 TABLET (20 MG TOTAL) BY MOUTH DAILY. 90 tablet 0   . pantoprazole (PROTONIX) 20 MG tablet Take 1 tablet (20 mg total) by mouth daily 30 tablet 0   . valsartan (DIOVAN) 80 MG tablet Take 1 tablet (80 mg total) by mouth daily 90 tablet 0     No current facility-administered medications for this visit.        Allergies:  No Known Allergies    Past Medical History:  Past Medical History:   Diagnosis Date   . Abnormal vision     reading   . Arthritis     L hip   . Avascular necrosis    . Chronic hip pain    . Depression screening negative 12-01-12    Neg   . Difficulty in walking(719.7)     limp .Marland KitchenMarland Kitchenpending  surgery   . GERD (gastroesophageal reflux disease)     on med   . H/O splenomegaly     Denies this condition   . Headache following lumbar puncture 2004    s/p spinal tap .... blood patch needed   . Hyperlipidemia     on med   . Hypertensive disorder     on med   . Sarcoid     treated with steroids       Past Surgical History:  Past Surgical History:   Procedure Laterality Date   . ARTHROPLASTY, HIP, TOTAL, ANTERIOR APPROACH, C ARM  03/10/2013    Procedure: ARTHROPLASTY, HIP, TOTAL, ANTERIOR APPROACH, C ARM;  Surgeon: Cynda Familia, MD;  Location: [facility];  Service: Orthopedics;  Laterality: Left;  LEFT HIP THA ANT     . COLONOSCOPY  2014   . JOINT REPLACEMENT      hip  and knee   . REPLACEMENT TOTAL KNEE  01-18-11    right   . TOTAL HIP ARTHROPLASTY  11-2004    Right       Family History:  Family History   Problem Relation Age of Onset   . Cancer Mother         ovarian   . Heart attack Father    . Heart disease Father    . Hyperlipidemia Father    . Hypertension Father    . Cancer Sister         breast cancer       Social History:  Social History     Social History   . Marital status: Married     Spouse name: N/A   . Number of children: N/A   . Years of education: N/A     Occupational History   . Not on file.     Social History Main Topics   . Smoking status: Never Smoker   . Smokeless tobacco: Never Used   . Alcohol use 2.0 oz/week     3 Glasses of wine per week   . Drug use: No   . Sexual activity: Not on file     Other Topics Concern   . Not on file     Social History Narrative    The patient is to [place] Cos[place] and has been since 1990, with 2 sons and one girl.  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       The following sections were reviewed this encounter by the provider:   Tobacco  Allergies  Meds  Problems  Med Hx  Surg Hx  Fam Hx  Soc Hx          ROS:  Review of Systems   Constitutional: Negative for fatigue.   HENT: Negative for nosebleeds and tinnitus.    Eyes:  Negative for visual disturbance.   Respiratory: Negative for chest tightness and shortness of breath.    Cardiovascular: Negative for chest pain and palpitations.   Neurological: Negative for dizziness, light-headedness and headaches.   Psychiatric/Behavioral: Negative for sleep disturbance. The patient is not nervous/anxious.        Vitals:  BP 129/80 (BP Site: Left arm, Patient Position: Sitting, Cuff Size: Medium)   Pulse 67   Temp 97.6 F (36.4 C) (Oral)   Wt 103.6 kg (228 lb 6.4 oz)   SpO2 97%   BMI 34.22 kg/m      Objective:     Physical Exam:  Physical Exam   Constitutional: He is oriented to person, place, and time. He appears well-developed and well-nourished. No distress.   HENT:   Head: Normocephalic and atraumatic.   Right Ear: External ear normal.   Left Ear: External ear normal.   Nose: Nose normal.   Mouth/Throat: Oropharynx is clear and moist. No oropharyngeal exudate.   Eyes: Pupils are equal, round, and reactive to light. Conjunctivae and EOM are normal. Right eye exhibits no discharge. No scleral icterus.   Neck: Neck supple. No JVD present. No thyromegaly present.   Cardiovascular: Normal rate, regular rhythm and normal heart sounds.  Exam reveals no gallop and no friction rub.    No murmur heard.  Pulmonary/Chest: Effort normal and breath sounds normal. No respiratory distress. He has no wheezes. He has no rales.   Abdominal: Soft. Bowel sounds are normal. He exhibits no distension. There is no tenderness.   Musculoskeletal: Normal range of  motion. He exhibits no edema or tenderness.   Lymphadenopathy:     He has no cervical adenopathy.   Neurological: He is alert and oriented to person, place, and time. No cranial nerve deficit.   Skin: Skin is warm and dry. No rash noted. He is not diaphoretic.   Psychiatric: He has a normal mood and affect.       Blood pressure 129/80, pulse 67, temperature 97.6 F (36.4 C), temperature source Oral, weight 103.6 kg (228 lb 6.4 oz), SpO2 97  %.      Assessment:     1.  Essential hypertension.  His blood pressures been doing well as far as he knows, but is really not checking.  He is taking his medications without difficulties and feels well.  Is having no chest pain, shortness of breath, dizziness, lightheadedness, or other concerns.  He overall feels quite well.  He is having no trouble with medication and is compliant.  I will leave him on the same medication for now.  His blood pressure here is at goal.  I will check some labs and encouraged him to continue work on weight loss.    2.  Mixed hyperlipidemia.  He is having no trouble with the medication and is taking it on a regular basis.  He is having no unusual muscle cramps.  He will continue working with diet and exercise.    3.  Reflux.  If he goes off the medicine entirely.  He still gets some reflux depending on what he is eating.  He has been very good about his diet except sometimes travel.  We will give her a refill but have recommended trying to decrease it to every other day or every 3rd day.  We discussed the potential side effects of long-term use of PPIs.    4.  Health maintenance.  He is overdue for his colonoscopy.  I recommended that he follow up with Dr. Assunta Found.  Additionally, I have recommended that he get the shingles shot.    Plan:     1.  Continue the good work with exercise.  2.  Continue to work on weight loss with heart healthy diet.  3.  Complete blood count, complete metabolic panel, lipids  4.  Continue the current medications.  5.  Colonoscopy.  6.  If all is going well.  Follow up with me for physical in 6 months.  7.  Shingles shot at the local pharmacy.    Chrisandra Carota, MD

## 2017-08-16 LAB — COMPREHENSIVE METABOLIC PANEL
ALT: 35 U/L (ref 0–55)
AST (SGOT): 22 U/L (ref 5–34)
Albumin/Globulin Ratio: 1.6 (ref 0.9–2.2)
Albumin: 4.1 g/dL (ref 3.5–5.0)
Alkaline Phosphatase: 59 U/L (ref 38–106)
BUN: 15 mg/dL (ref 9.0–28.0)
Bilirubin, Total: 0.6 mg/dL (ref 0.2–1.2)
CO2: 24 mEq/L (ref 21–29)
Calcium: 9.6 mg/dL (ref 8.5–10.5)
Chloride: 107 mEq/L (ref 100–111)
Creatinine: 1.2 mg/dL (ref 0.5–1.5)
Globulin: 2.6 g/dL (ref 2.0–3.7)
Glucose: 80 mg/dL (ref 70–100)
Potassium: 4.4 mEq/L (ref 3.5–5.1)
Protein, Total: 6.7 g/dL (ref 6.0–8.3)
Sodium: 140 mEq/L (ref 136–145)

## 2017-08-16 LAB — HEMOLYSIS INDEX: Hemolysis Index: 14 (ref 0–18)

## 2017-08-16 LAB — LIPID PANEL
Cholesterol / HDL Ratio: 2.8
Cholesterol: 180 mg/dL (ref 0–199)
HDL: 65 mg/dL (ref 40–9999)
LDL Calculated: 96 mg/dL (ref 0–99)
Triglycerides: 94 mg/dL (ref 34–149)
VLDL Calculated: 19 mg/dL (ref 10–40)

## 2017-08-16 LAB — HEPATITIS C ANTIBODY: Hepatitis C, AB: NONREACTIVE

## 2017-08-16 LAB — GFR: EGFR: >60

## 2017-11-08 ENCOUNTER — Other Ambulatory Visit (INDEPENDENT_AMBULATORY_CARE_PROVIDER_SITE_OTHER): Payer: Self-pay | Admitting: Family Medicine

## 2017-11-08 DIAGNOSIS — I10 Essential (primary) hypertension: Secondary | ICD-10-CM

## 2018-06-03 ENCOUNTER — Inpatient Hospital Stay: Payer: No Typology Code available for payment source | Attending: Physician Assistant

## 2018-06-03 DIAGNOSIS — M79602 Pain in left arm: Secondary | ICD-10-CM | POA: Insufficient documentation

## 2018-06-03 DIAGNOSIS — S46192D Other injury of muscle, fascia and tendon of long head of biceps, left arm, subsequent encounter: Secondary | ICD-10-CM | POA: Insufficient documentation

## 2018-06-03 DIAGNOSIS — X58XXXD Exposure to other specified factors, subsequent encounter: Secondary | ICD-10-CM | POA: Insufficient documentation

## 2018-06-03 NOTE — Progress Notes (Signed)
Name:Jerry Hickman Age: 63 y.o.   Date of Service: 06/03/2018  Referring Physician: Eulis Manly, PA   Date of Injury: 05/21/2018  Date Care Plan Established/Reviewed: 06/03/2018  Date Treatment Started: 06/03/2018  End of Certification Date: 08/31/2018  Sessions in Plan of Care: 16  Surgery Date: 05/21/2018      Visit Count: 1   Diagnosis:   1. Other injury of muscle, fascia and tendon of long head of biceps, left arm, subsequent encounter    2. Arm pain, anterior, left        Subjective     History of Present Illness   History of Present Illness: Pt reports post-operative on 05/21/2018 from left shoulder arthroscopy with debridement and SAD, DCE, open subpectoral biceps tenodesis surgery. He reports over time noticing wear and tear of left shoulder. He is left-handed. PMH of RTC repair in 1993 left shoulder. This past Thanksgiving noticed a lot of pain, problems with reaching and sleeping on left side. No PT prior to surgery.   He has been in the gym walking on TM, wearing sling but did not wear it today. His one week post-operative visit was last Friday, thinks he can actively move the arm now, and is washing his hair, actively moving left shoulder and that his PA told him to use his arm.   Pt's goals are to return to lifting at the gym including chest press, bicep curls, etc. and golf.   Medical history includes HTN, left THR 2006, right THR 2012, right TKR 2014.   Functional Limitations (PLOF): Currently reports ability to push up on his hands from chair and carrying a shopping bag with no difficulty (prior same), moderate difficulty with weakness in left shoulder, reaching for a shelf (prior no difficulty), unable to perform recreation and activities with impact through left arm like hammering (prior able without restriction).     Outcome Measure   Tool Used/Details: FOTO  Score: 52  Predicted Functional Outcome: 68    Pain   Current pain rating: 4  At best pain rating: 2  At worst pain rating: 5  Location: left  shoulder    Social Support/Occupation      Occupation: Production designer, theatre/television/film, desk work      Precautions: No data was found  Allergies: Patient has no known allergies.    Past Medical History:   Diagnosis Date    Abnormal vision     reading    Arthritis     L hip    Avascular necrosis     Chronic hip pain     Depression screening negative 12-01-12    Neg    Difficulty in walking(719.7)     limp .Marland KitchenMarland Kitchenpending surgery    GERD (gastroesophageal reflux disease)     on med    H/O splenomegaly     Denies this condition    Headache following lumbar puncture 2004    s/p spinal tap .... blood patch needed    Hyperlipidemia     on med    Hypertensive disorder     on med    Sarcoid     treated with steroids       Objective   Seated BP: 155/92 mmHg  Seated HR: 63 bpm    Range of Motion   Cervical AROM WFL. Right Elbow AROM WFL. Left Wrist AROM WFL. Right Wrist AROM WFL.    IE   Left AROM IE   Left PROM   Shoulder IE   Right  AROM IE   Right PROM     130  Flexion 165          Extension 53        80  Abduction 175        63  Internal Rotation FIR TV9  65      54   External Rotation FER TV3  103    (blank fields were intentionally left blank)    IE   Left AROM IE   Left PROM   Elbow/Wrist IE   Right AROM IE   Right PROM     134  Elbow Flexion 140        16  Elbow Extension 0          Forearm Supination           Forearm Pronation           Wrist Flexion           Wrist Extension           Radial Deviation           Ulnar Deviation       (blank fields were intentionally left blank)        Strength     IE   Left Strength  Shoulder  MMT IE   Right     Neck Lateral Flexion        Shoulder Flexion 5      Shoulder Extension        Shoulder Abduction 5      Shoulder IR 5      Shoulder ER 5      Serratus       Rhomboids       Upper Trapezius       Middle Trapezius       Lower Trapezius       Biceps 5      Triceps 5    (blank fields were intentionally left blank)        Palpation Increased tension left distal biceps and bruising in lower aspect  of anterior upper arm, slight tension L deltoid and rotator cuff. Incisions healing well without signs of infection or opening.     Joint Mobility     Right Shoulder  Joints within functional limits are the anterior capsule. Hypomobile in the posterior capsule and inferior capsule.     Scapular Mobility   Left Shoulder   Scapular mobility: good    Right Shoulder   Scapular mobility: [facility]    Neurological Testing     Sensation     Shoulder   Left Shoulder   Diminished: light touch    Right Shoulder   Intact: light touch             Treatment     Therapeutic Exercises   Justification: To improve flexibility, strength   Access Code: [email or web address]   URL: [email or web address]   Date: 06/03/2018   Prepared by: Gray Bernhardt      Exercises Supine Elbow Extension Stretch in Supination - 10 reps - 1 sets - 10 seconds hold - 1x daily - 7x weekly   Seated Scapular Retraction - 10 reps - 2 sets - 5 seconds hold - 3x daily - 7x weekly   Seated Shoulder Shrug Circles AROM Backward - 10 reps - 1 sets - 1 hold - 3x daily - 7x weekly   Isometric Shoulder Adduction - 10 reps - 2 sets - 5 seconds hold -  1x daily - 7x weekly       Therapeutic Activity   Justification: Pt education  Discussed PROM only for the left shoulder, to not reach up to wash his hair yet due to AROM of left shoulder, and not to carry shopping bags or push up from his hands with his left arm yet. PROM of left shoulder, and AROM of left elbow. Isometrics will be performed. Discussed safe motions, and to continue wearing his sling during the day.   Discussed use of left shoulder ice sleeve 10-15 minutes at a time.        ---      ---   Total Time   Timed Minutes  20 minutes   Untimed Minutes  20 minutes   Total Time  40 minutes        Assessment   Jerry Hickman is a 63 y.o. male presenting s/p 05/21/2018 from left shoulder arthroscopy with debridement and SAD, DCE, open subpectoral biceps tenodesis surgery who requires Physical Therapy for the following:  Impairments:  left shoulder post-operative, weakness, limited A/PROM in left UE, swelling and tension from post-operative healing requiring use of sling    Pain located: left shoulder    Clinical presentation: stable due to post-operative without signs of infection  Barriers to therapy: Past surgical history prior rotator cuff repair in this shoulder, poor compliance with precautions of sling use and passive motion only, and hypertension.   Prior Level of Function: Currently reports ability to push up on his hands from chair and carrying a shopping bag with no difficulty (prior same), moderate difficulty with weakness in left shoulder, reaching for a shelf (prior no difficulty), unable to perform recreation and activities with impact through left arm like hammering (prior able without restriction).   Prognosis: good  Plan   Visits per week: 2  Number of Sessions: 16  Direct One on One  02725: Therapeutic Exercise: To Develop Strength and Endurance, ROM and Flexibility  [id number]: Neuromuscular Reeducation  97140: Manual Therapy techniques (mobilization, manipulation, manual traction) (Soft tissue mobilization and Joint Mobs Grade 1-4)  97530: Therapeutic Activities: Dynamic activities to improve functional performance  36644: Ultrasound  Dry Needling  Supervised Modalities  97010: Thermal modalities: hot/cold packs  03474: Electrical stimulation  97750: Physical performance test or measurement (eg, musculoskeletal, functional capacity).     Next visit continue education regarding passive use of left shoulder only, with isometrics, until cleared for AROM from MD; L elbow AROM; begin pendulums, elbow AROM, review HEP and update, ball squeezes, emphasize use of sling; use game ready.       Goals    Goal 1:  Patient will demonstrate independence in prescribed HEP with proper form, sets and reps for safe discharge to an independent program.     Sessions:  16      Goal 2:  Pt will improve FOTO to 68.    Sessions:  16      Goal 3:  Pt will  report use of sling for first 4 weeks and adherence to precautions.    Sessions:  4      Goal 4:  Pt will demonstrate same left shoulder AROM into flexion, abduction and extension as right shoulder and report ability to reach to overhead cabinet without discomfort.    Sessions:  16          Goal 5:  Pt will demonstrate left elbow extension AROM 0 degrees to reach for a car door without discomfort.  Sessions:  8      Goal 6:  Pt will demonstrate 5/5 MMT in left UE including biceps without discomfort to lift and carry heavy groceries without compensation.    Sessions:  16      Goal 7:  Pt will demonstrate proper lifting mechanics with resistance to return to bench press and overhead motions without discomfort.    Sessions:  [address]                   Gray Bernhardt, PT

## 2018-06-08 ENCOUNTER — Inpatient Hospital Stay: Payer: No Typology Code available for payment source | Attending: Physician Assistant

## 2018-06-08 DIAGNOSIS — M79602 Pain in left arm: Secondary | ICD-10-CM | POA: Insufficient documentation

## 2018-06-08 DIAGNOSIS — S46192D Other injury of muscle, fascia and tendon of long head of biceps, left arm, subsequent encounter: Secondary | ICD-10-CM | POA: Insufficient documentation

## 2018-06-08 NOTE — PT/OT Therapy Note (Signed)
Name: Jerry Hickman Age: 64 y.o.   Date of Service: 06/08/2018  Referring Physician: Eulis Manly, PA   Date of Injury: 05/21/2018  Date Care Plan Established/Reviewed: 06/03/2018  Date Treatment Started: 06/03/2018  End of Certification Date: 08/31/2018  Sessions in Plan of Care: 16  Surgery Date: 05/21/2018    Visit Count: 2   Diagnosis:   1. Other injury of muscle, fascia and tendon of long head of biceps, left arm, subsequent encounter    2. Arm pain, anterior, left             Precautions: No data was found  Allergies: Patient has no known allergies.    Subjective:  Jerry Hickman reports no problems with the left shoulder recently; using the sling at work, and he is doing his HEP.   Functional Status: stable post-op 05/21/2018 from left shoulder arthroscopy with debridement and SAD, DCE, open subpectoral biceps tenodesis.     OBJECTIVE: Current Measurements (ROM, Strength, Girth, Outcomes, etc.):    Range of Motion     IE   Left AROM IE Left PROM   Left PROM   Shoulder IE Right AROM   Right AROM IE Right PROM   Right PROM     130  Flexion 165          Extension 53        80  Abduction 175        63  Internal Rotation FIR TV9  65      54   External Rotation FER TV3  103    (blank fields were intentionally left blank)    IE   Left AROM IE   Left PROM   Elbow/Wrist IE   Right AROM IE   Right PROM     134  Elbow Flexion 140        16  Elbow Extension 0          Forearm Supination           Forearm Pronation           Wrist Flexion           Wrist Extension           Radial Deviation           Ulnar Deviation       (blank fields were intentionally left blank)        Strength     IE   Left Strength  Shoulder  MMT IE   Right     Neck Lateral Flexion        Shoulder Flexion 5      Shoulder Extension        Shoulder Abduction 5      Shoulder IR 5      Shoulder ER 5      Serratus       Rhomboids        Upper Trapezius       Middle Trapezius       Lower Trapezius       Biceps 5      Triceps 5    (blank fields were intentionally left blank)      THERAPEUTIC EXERCISE (To improve or develop  Flexibility/ROM,  Strength  and endurance)     Warm-up-  Subjective obtained to assist with planning today's visit  Modifications/Patient Education: Progressed exercises and HEP per exercise flow sheet Cuing (Verbal, Visual and Tactile cues).  Exercise Flow Sheet  Exercise Specifics  06/08/18            Pendulums   Flex/ext, abd/add, cw/ccw 2' ea  AC            S/L scapular retraction and depression NMR c heavy therapist cueing 10x5" in each direction  AC            Supine elbow AROM flex/ext   Forearm supination and in pronation 12x3" ea  AC            Seated scap retraction    10x5" B  AC            Seated backward shoulder circles    15x B  AC            Seated isometrics   L shoulder ext, abd, add, IR and ER sub-maximal  NMR Against PT resistance 25-50% effort  ~5x5" ea  AC Add flexion           Digiflex   green 15x ea digit  AC            Forearm supination/pronation   seated Elbow supported, first 0# 10x ea, then 2# wt in hand 15x ea  AC             UT stretch   seated 2x30" ea  AC            Triceps press   Isometric, elbow in 90 deg flexion  add                           Home Exercise Program    Updated HEP            (all exercises supervised by clinician unless noted otherwise)   Home exercise program:   Access Code: [email or web address]   URL: [email or web address]   Date: 06/08/2018   Prepared by: Gray Bernhardt     Exercises   Supine Elbow Extension Stretch in Supination - 10 reps - 1 sets - 10 seconds hold - 1x daily - 7x weekly   Seated Scapular Retraction - 10 reps - 2 sets - 5 seconds hold - 3x daily - 7x weekly   Seated Shoulder Shrug Circles AROM Backward - 10 reps - 1 sets - 1 hold - 3x daily - 7x weekly   Isometric Shoulder Adduction - 10 reps - 2 sets - 5 seconds hold - 1x daily - 7x weekly    Horizontal Shoulder Pendulum with Table Support - 10 reps - 2 sets - 1 hold - 2x daily - 7x weekly   Flexion-Extension Shoulder Pendulum with Table Support - 10 reps - 2 sets - 1 hold - 2x daily - 7x weekly   Circular Shoulder Pendulum with Table Support - 10 reps - 2 sets - 1 hold - 2x daily - 7x weekly   Standing Elbow Flexion Extension AROM - 10 reps - 2 sets - 1 hold - 2x daily - 7x weekly           MANUAL THERAPY ( To improve joint mobility, soft tissue mobility, and reduce trigger points)  Pt supine: STM left biceps, pectoralis major and minor; Gr 1-2 L GH jt posterior and inferior glides.        NEUROMUSCULAR RE-ED (For activation and/or inhibition of target muscle,- for movement, balance, coordination, kinesthetic sense, posture, and/ or proprioception for sitting and/ or standing activities  Cueing for scapular positioning (see flowsheet marked NMR)  THERAPEUTIC ACTIVITY: (For improvement in the completion of ADL's and functional activities.  Dynamic activities to improve functional performance, direct (one-on-one) with the patient   N/A    MODALITIES None  Therapy Rationale: Other: pt deferred vasopneumatic compression; has one at home                Assessment (response to treatment):   Pt demonstrates decent scapular awareness with cueing, and requires cueing to maintain good scapular control with TE to avoid discomfort in anterior shoulder. Little c/o discomfort during daily routing so far, and is compliant with restrictions. He reported no discomfort with exercises.           ---      ---   Total Time   Timed Minutes  45 minutes   Total Time  45 minutes         Plan:Patient requires continued skilled care to meet goals.       Boykin, PT  [place] [address]  06/08/2018                            ---      ---   Total Time   Timed Minutes  45 minutes   Total Time  45 minutes           Goals    Goal 1:  Patient will demonstrate independence in prescribed HEP with proper form, sets and reps for safe  discharge to an independent program.     Sessions:  16      Goal 2:  Pt will improve FOTO to 68.    Sessions:  16      Goal 3:  Pt will report use of sling for first 4 weeks and adherence to precautions.    Sessions:  4      Goal 4:  Pt will demonstrate same left shoulder AROM into flexion, abduction and extension as right shoulder and report ability to reach to overhead cabinet without discomfort.    Sessions:  16          Goal 5:  Pt will demonstrate left elbow extension AROM 0 degrees to reach for a car door without discomfort.    Sessions:  8      Goal 6:  Pt will demonstrate 5/5 MMT in left UE including biceps without discomfort to lift and carry heavy groceries without compensation.    Sessions:  16      Goal 7:  Pt will demonstrate proper lifting mechanics with resistance to return to bench press and overhead motions without discomfort.    Sessions:  [address]                   Gray Bernhardt, PT

## 2018-06-11 ENCOUNTER — Inpatient Hospital Stay: Payer: No Typology Code available for payment source | Attending: Physician Assistant

## 2018-06-11 DIAGNOSIS — M79602 Pain in left arm: Secondary | ICD-10-CM | POA: Insufficient documentation

## 2018-06-11 DIAGNOSIS — S46192D Other injury of muscle, fascia and tendon of long head of biceps, left arm, subsequent encounter: Secondary | ICD-10-CM | POA: Insufficient documentation

## 2018-06-11 NOTE — PT/OT Therapy Note (Signed)
Name: Jerry Hickman Age: 63 y.o.   Date of Service: 06/11/2018  Referring Physician: Eulis Manly, PA   Date of Injury: 05/21/2018  Date Care Plan Established/Reviewed: 06/03/2018  Date Treatment Started: 06/03/2018  End of Certification Date: 08/31/2018  Sessions in Plan of Care: 16  Surgery Date: 05/21/2018    Visit Count: 3   Diagnosis:   1. Other injury of muscle, fascia and tendon of long head of biceps, left arm, subsequent encounter    2. Arm pain, anterior, left               Precautions: No data was found  Allergies: Patient has no known allergies.    Subjective:  Jerry Hickman reports his shoulder feels great, he is wearing his sling per PT instruction.  PA said 3 weeks and I could discontinue sling  Functional Status: stable post-op 05/21/2018 from left shoulder arthroscopy with debridement and SAD, DCE, open subpectoral biceps tenodesis.     OBJECTIVE: Current Measurements (ROM, Strength, Girth, Outcomes, etc.):    Range of Motion     IE   Left AROM IE Left PROM   Left PROM   Shoulder IE Right AROM   Right AROM IE Right PROM   Right PROM     130  Flexion 165          Extension 53        80  Abduction 175        63  Internal Rotation FIR TV9  65      54   External Rotation FER TV3  103    (blank fields were intentionally left blank)    IE   Left AROM IE   Left PROM   Elbow/Wrist IE   Right AROM IE   Right PROM     134  Elbow Flexion 140        16  Elbow Extension 0          Forearm Supination           Forearm Pronation           Wrist Flexion           Wrist Extension           Radial Deviation           Ulnar Deviation       (blank fields were intentionally left blank)        Strength     IE   Left Strength  Shoulder  MMT IE   Right     Neck Lateral Flexion        Shoulder Flexion 5      Shoulder Extension        Shoulder Abduction 5      Shoulder IR 5      Shoulder ER 5      Serratus        Rhomboids       Upper Trapezius       Middle Trapezius       Lower Trapezius       Biceps 5      Triceps 5    (blank fields were intentionally left blank)      THERAPEUTIC EXERCISE (To improve or develop  Flexibility/ROM,  Strength  and endurance)     Warm-up-  Subjective obtained to assist with planning today's visit  Modifications/Patient Education: Progressed exercises and HEP per exercise flow sheet Cuing (Verbal, Visual and Tactile cues).  Exercise  Flow Sheet  Exercise Specifics 06/08/18 06/10/2018           Pendulums   Flex/ext, abd/add, cw/ccw 2' ea  AC KG           S/L scapular retraction and depression NMR c heavy therapist cueing 10x5" in each direction  AC NMR  KG           Supine elbow AROM flex/ext   Forearm supination and in pronation 12x3" ea  AC KG           Seated scap retraction    10x5" B  AC Standing  10x5"  KG           Seated backward shoulder circles    15x B  AC Standing  15x  KG           Seated isometrics   L shoulder ext, abd, add, IR and ER sub-maximal  NMR Against PT resistance 25-50% effort  ~5x5" ea  AC + flexion  5x5"  KG             Digiflex   green 15x ea digit  AC KG           Forearm supination/pronation   seated Elbow supported, first 0# 10x ea, then 2# wt in hand 15x ea  AC             UT stretch   seated 2x30" ea  AC            Triceps press   Isometric, elbow in 90 deg flexion  5"x5  KG                           Home Exercise Program    Updated HEP            (all exercises supervised by clinician unless noted otherwise)   Home exercise program:   Access Code: [email or web address]   URL: [email or web address]   Date: 06/08/2018   Prepared by: Gray Bernhardt     Exercises   Supine Elbow Extension Stretch in Supination - 10 reps - 1 sets - 10 seconds hold - 1x daily - 7x weekly   Seated Scapular Retraction - 10 reps - 2 sets - 5 seconds hold - 3x daily - 7x weekly   Seated Shoulder Shrug Circles AROM Backward - 10 reps - 1 sets - 1 hold - 3x daily - 7x  weekly   Isometric Shoulder Adduction - 10 reps - 2 sets - 5 seconds hold - 1x daily - 7x weekly   Horizontal Shoulder Pendulum with Table Support - 10 reps - 2 sets - 1 hold - 2x daily - 7x weekly   Flexion-Extension Shoulder Pendulum with Table Support - 10 reps - 2 sets - 1 hold - 2x daily - 7x weekly   Circular Shoulder Pendulum with Table Support - 10 reps - 2 sets - 1 hold - 2x daily - 7x weekly   Standing Elbow Flexion Extension AROM - 10 reps - 2 sets - 1 hold - 2x daily - 7x weekly           MANUAL THERAPY ( To improve joint mobility, soft tissue mobility, and reduce trigger points)  Pt supine: STM left biceps, pectoralis major and minor; Gr 1-2 L GH jt posterior and inferior glides.        NEUROMUSCULAR RE-ED (For activation and/or inhibition of target muscle,- for movement, balance, coordination, kinesthetic  sense, posture, and/ or proprioception for sitting and/ or standing activities  Cueing for scapular positioning (see flowsheet marked NMR)      THERAPEUTIC ACTIVITY: (For improvement in the completion of ADL's and functional activities.  Dynamic activities to improve functional performance, direct (one-on-one) with the patient   N/A    MODALITIES None  Therapy Rationale: Other: pt deferred vasopneumatic compression; has one at home                Assessment (response to treatment):   Pt is progressing very well no pain, instructed to stay in sling per PA instruction. Inspected biceps incision clean and well healed.    ---      ---   Total Time   Timed Minutes  40 minutes   Total Time  40 minutes         Plan:Patient requires continued skilled care to meet goals.      06/12/2018                                    ---      ---   Total Time   Timed Minutes  40 minutes   Total Time  40 minutes             Goals    Goal 1:  Patient will demonstrate independence in prescribed HEP with proper form, sets and reps for safe discharge to an independent program.     Sessions:  16      Goal 2:  Pt will improve FOTO  to 68.    Sessions:  16      Goal 3:  Pt will report use of sling for first 4 weeks and adherence to precautions.    Sessions:  4      Goal 4:  Pt will demonstrate same left shoulder AROM into flexion, abduction and extension as right shoulder and report ability to reach to overhead cabinet without discomfort.    Sessions:  16          Goal 5:  Pt will demonstrate left elbow extension AROM 0 degrees to reach for a car door without discomfort.    Sessions:  8      Goal 6:  Pt will demonstrate 5/5 MMT in left UE including biceps without discomfort to lift and carry heavy groceries without compensation.    Sessions:  16      Goal 7:  Pt will demonstrate proper lifting mechanics with resistance to return to bench press and overhead motions without discomfort.    Sessions:  [address], [place]

## 2018-06-15 ENCOUNTER — Inpatient Hospital Stay: Payer: No Typology Code available for payment source | Attending: Physician Assistant

## 2018-06-15 DIAGNOSIS — S46192D Other injury of muscle, fascia and tendon of long head of biceps, left arm, subsequent encounter: Secondary | ICD-10-CM | POA: Insufficient documentation

## 2018-06-15 DIAGNOSIS — M79602 Pain in left arm: Secondary | ICD-10-CM | POA: Insufficient documentation

## 2018-06-15 NOTE — PT/OT Therapy Note (Signed)
Name: Jerry Hickman Age: 63 y.o.   Date of Service: 06/15/2018  Referring Physician: Eulis Manly, PA   Date of Injury: 05/21/2018  Date Care Plan Established/Reviewed: 06/03/2018  Date Treatment Started: 06/03/2018  End of Certification Date: 08/31/2018  Sessions in Plan of Care: 16  Surgery Date: 05/21/2018    Visit Count: 4   Diagnosis:   1. Other injury of muscle, fascia and tendon of long head of biceps, left arm, subsequent encounter    2. Arm pain, anterior, left               Precautions: No data was found  Allergies: Patient has no known allergies.    Subjective:  Matti reports he is wearing sling but left in car for today's appt.  Shoulder feels really good no pain at all almost forget I have had surgery.  Functional Status: stable post-op 05/21/2018 from left shoulder arthroscopy with debridement and SAD, DCE, open subpectoral biceps tenodesis.     OBJECTIVE: Current Measurements (ROM, Strength, Girth, Outcomes, etc.):    Range of Motion     IE   Left AROM IE Left PROM   Left PROM  06/15/2018   Shoulder IE Right AROM   Right AROM IE Right PROM   Right PROM     130 145 Flexion 165          Extension 53        80 95 Abduction 175        63 69 Internal Rotation FIR TV9  65      54  55 External Rotation FER TV3  103    (blank fields were intentionally left blank)    IE   Left AROM IE   Left PROM   Elbow/Wrist IE   Right AROM IE   Right PROM     134  Elbow Flexion 140        16  Elbow Extension 0          Forearm Supination           Forearm Pronation           Wrist Flexion           Wrist Extension           Radial Deviation           Ulnar Deviation       (blank fields were intentionally left blank)        Strength     IE   Left Strength  Shoulder  MMT IE   Right     Neck Lateral Flexion        Shoulder Flexion 5      Shoulder Extension        Shoulder Abduction 5      Shoulder IR 5       Shoulder ER 5      Serratus       Rhomboids       Upper Trapezius       Middle Trapezius       Lower Trapezius       Biceps 5      Triceps 5    (blank fields were intentionally left blank)      THERAPEUTIC EXERCISE (To improve or develop  Flexibility/ROM,  Strength  and endurance)     Warm-up-  Subjective obtained to assist with planning today's visit  Modifications/Patient Education: Progressed exercises and HEP per exercise flow sheet Cuing (Verbal,  Visual and Tactile cues).  Exercise Flow Sheet  Exercise Specifics 06/08/18 06/10/2018 06/15/2018          Pendulums   Flex/ext, abd/add, cw/ccw 2' ea  AC KG KG          S/L scapular retraction and depression NMR c heavy therapist cueing 10x5" in each direction  AC NMR  KG NMR  KG          Supine elbow AROM flex/ext   Forearm supination and in pronation 12x3" ea  AC KG Kg          Seated scap retraction    10x5" B  AC Standing  10x5"  KG Standing  KG          Seated backward shoulder circles    15x B  AC Standing  15x  KG KG          Seated isometrics   L shoulder ext, abd, add, IR and ER sub-maximal  NMR Against PT resistance 25-50% effort  ~5x5" ea  AC + flexion  5x5"  KG   KG          Digiflex   green 15x ea digit  AC KG Blue digiflex 1 min  Blue putty  1 min  KG          Forearm supination/pronation   seated Elbow supported, first 0# 10x ea, then 2# wt in hand 15x ea  AC   KG          UT stretch   seated 2x30" ea  AC            Triceps press   Isometric, elbow in 90 deg flexion  5"x5  KG KG                          Home Exercise Program    Updated HEP            (all exercises supervised by clinician unless noted otherwise)   Home exercise program:   Access Code: [email or web address]   URL: [email or web address]   Date: 06/08/2018   Prepared by: Gray Bernhardt     Exercises   Supine Elbow Extension Stretch in Supination - 10 reps - 1 sets - 10 seconds hold - 1x daily - 7x weekly   Seated Scapular Retraction - 10 reps - 2 sets - 5 seconds hold  - 3x daily - 7x weekly   Seated Shoulder Shrug Circles AROM Backward - 10 reps - 1 sets - 1 hold - 3x daily - 7x weekly   Isometric Shoulder Adduction - 10 reps - 2 sets - 5 seconds hold - 1x daily - 7x weekly   Horizontal Shoulder Pendulum with Table Support - 10 reps - 2 sets - 1 hold - 2x daily - 7x weekly   Flexion-Extension Shoulder Pendulum with Table Support - 10 reps - 2 sets - 1 hold - 2x daily - 7x weekly   Circular Shoulder Pendulum with Table Support - 10 reps - 2 sets - 1 hold - 2x daily - 7x weekly   Standing Elbow Flexion Extension AROM - 10 reps - 2 sets - 1 hold - 2x daily - 7x weekly           MANUAL THERAPY ( To improve joint mobility, soft tissue mobility, and reduce trigger points)  Pt supine: STM left biceps, pectoralis major and minor; Gr 1-2 L GH jt posterior and inferior glides.  NEUROMUSCULAR RE-ED (For activation and/or inhibition of target muscle,- for movement, balance, coordination, kinesthetic sense, posture, and/ or proprioception for sitting and/ or standing activities  Cueing for scapular positioning (see flowsheet marked NMR)      THERAPEUTIC ACTIVITY: (For improvement in the completion of ADL's and functional activities.  Dynamic activities to improve functional performance, direct (one-on-one) with the patient   N/A    MODALITIES None  Therapy Rationale: Other: pt deferred vasopneumatic compression; has one at home                Assessment (response to treatment):   Lary is progressing well with PROM, no pain at end ranges   ---      ---   Total Time   Timed Minutes  45 minutes   Total Time  45 minutes         Plan:Patient requires continued skilled care to meet goals.      06/16/2018                                    ---      ---   Total Time   Timed Minutes  45 minutes   Total Time  45 minutes             Goals    Goal 1:  Patient will demonstrate independence in prescribed HEP with proper form, sets and reps for safe discharge to an independent program.     Sessions:   16      Goal 2:  Pt will improve FOTO to 68.    Sessions:  16      Goal 3:  Pt will report use of sling for first 4 weeks and adherence to precautions.    Sessions:  4      Goal 4:  Pt will demonstrate same left shoulder AROM into flexion, abduction and extension as right shoulder and report ability to reach to overhead cabinet without discomfort.    Sessions:  16          Goal 5:  Pt will demonstrate left elbow extension AROM 0 degrees to reach for a car door without discomfort.    Sessions:  8      Goal 6:  Pt will demonstrate 5/5 MMT in left UE including biceps without discomfort to lift and carry heavy groceries without compensation.    Sessions:  16      Goal 7:  Pt will demonstrate proper lifting mechanics with resistance to return to bench press and overhead motions without discomfort.    Sessions:  [address] [place]

## 2018-06-18 ENCOUNTER — Inpatient Hospital Stay: Payer: No Typology Code available for payment source | Attending: Physician Assistant

## 2018-06-18 DIAGNOSIS — S46192D Other injury of muscle, fascia and tendon of long head of biceps, left arm, subsequent encounter: Secondary | ICD-10-CM | POA: Insufficient documentation

## 2018-06-18 DIAGNOSIS — M79602 Pain in left arm: Secondary | ICD-10-CM | POA: Insufficient documentation

## 2018-06-18 NOTE — PT/OT Therapy Note (Signed)
Name: CHRIS CRIPPS Age: 63 y.o.   Date of Service: 06/18/2018  Referring Physician: Eulis Manly, PA   Date of Injury: 05/21/2018  Date Care Plan Established/Reviewed: 06/03/2018  Date Treatment Started: 06/03/2018  End of Certification Date: 08/31/2018  Sessions in Plan of Care: 16  Surgery Date: 05/21/2018    Visit Count: 5   Diagnosis:   1. Other injury of muscle, fascia and tendon of long head of biceps, left arm, subsequent encounter    2. Arm pain, anterior, left               Precautions: No data was found  Allergies: Patient has no known allergies.    Subjective:  Undra reports he is anxious to get back to using arm, I don't have any pain so sometimes I forget and use that arm  Functional Status: stable post-op 05/21/2018 from left shoulder arthroscopy with debridement and SAD, DCE, open subpectoral biceps tenodesis.     OBJECTIVE: Current Measurements (ROM, Strength, Girth, Outcomes, etc.):    Range of Motion     IE   Left AROM IE Left PROM   Left PROM  06/15/2018   Shoulder IE Right AROM   Right AROM IE Right PROM   Right PROM     130 145 Flexion 165          Extension 53        80 95 Abduction 175        63 69 Internal Rotation FIR TV9  65      54  55 External Rotation FER TV3  103    (blank fields were intentionally left blank)    IE   Left AROM IE   Left PROM   Elbow/Wrist IE   Right AROM IE   Right PROM     134  Elbow Flexion 140        16  Elbow Extension 0          Forearm Supination           Forearm Pronation           Wrist Flexion           Wrist Extension           Radial Deviation           Ulnar Deviation       (blank fields were intentionally left blank)        Strength     IE   Left Strength  Shoulder  MMT IE   Right     Neck Lateral Flexion        Shoulder Flexion 5      Shoulder Extension        Shoulder Abduction 5      Shoulder IR 5      Shoulder ER 5       Serratus       Rhomboids       Upper Trapezius       Middle Trapezius       Lower Trapezius       Biceps 5      Triceps 5    (blank fields were intentionally left blank)      THERAPEUTIC EXERCISE (To improve or develop  Flexibility/ROM,  Strength  and endurance)     Warm-up-  Subjective obtained to assist with planning today's visit  Modifications/Patient Education: Progressed exercises and HEP per exercise flow sheet Cuing (Verbal, Visual and Tactile cues).  Exercise Flow Sheet  Exercise Specifics 06/08/18 06/10/2018 06/15/2018 06/18/2018         Pendulums   Flex/ext, abd/add, cw/ccw 2' ea  AC KG KG KG         S/L scapular retraction and depression NMR c heavy therapist cueing 10x5" in each direction  AC NMR  KG NMR  KG NMR  KG         Supine elbow AROM flex/ext   Forearm supination and in pronation 12x3" ea  AC KG Kg KG         Seated scap retraction    10x5" B  AC Standing  10x5"  KG Standing  KG KG         Seated backward shoulder circles    15x B  AC Standing  15x  KG KG KG         Seated isometrics   L shoulder ext, abd, add, IR and ER sub-maximal  NMR Against PT resistance 25-50% effort  ~5x5" ea  AC + flexion  5x5"  KG   KG KG         Digiflex   green 15x ea digit  AC KG Blue digiflex 1 min  Blue putty  1 min  KG KG         Forearm supination/pronation   seated Elbow supported, first 0# 10x ea, then 2# wt in hand 15x ea  AC   KG KG         UT stretch   seated 2x30" ea  AC            Triceps press   Isometric, elbow in 90 deg flexion  5"x5  KG KG KG                         Home Exercise Program    Updated HEP            (all exercises supervised by clinician unless noted otherwise)   Home exercise program:   Access Code: [email or web address]   URL: [email or web address]   Date: 06/08/2018   Prepared by: Gray Bernhardt     Exercises   Supine Elbow Extension Stretch in Supination - 10 reps - 1 sets - 10 seconds hold - 1x daily - 7x weekly   Seated Scapular Retraction - 10 reps - 2 sets - 5  seconds hold - 3x daily - 7x weekly   Seated Shoulder Shrug Circles AROM Backward - 10 reps - 1 sets - 1 hold - 3x daily - 7x weekly   Isometric Shoulder Adduction - 10 reps - 2 sets - 5 seconds hold - 1x daily - 7x weekly   Horizontal Shoulder Pendulum with Table Support - 10 reps - 2 sets - 1 hold - 2x daily - 7x weekly   Flexion-Extension Shoulder Pendulum with Table Support - 10 reps - 2 sets - 1 hold - 2x daily - 7x weekly   Circular Shoulder Pendulum with Table Support - 10 reps - 2 sets - 1 hold - 2x daily - 7x weekly   Standing Elbow Flexion Extension AROM - 10 reps - 2 sets - 1 hold - 2x daily - 7x weekly           MANUAL THERAPY ( To improve joint mobility, soft tissue mobility, and reduce trigger points)  Pt supine: STM left biceps, pectoralis major and minor; Gr 1-2 L GH jt posterior and inferior glides.  NEUROMUSCULAR RE-ED (For activation and/or inhibition of target muscle,- for movement, balance, coordination, kinesthetic sense, posture, and/ or proprioception for sitting and/ or standing activities  Cueing for scapular positioning (see flowsheet marked NMR)      THERAPEUTIC ACTIVITY: (For improvement in the completion of ADL's and functional activities.  Dynamic activities to improve functional performance, direct (one-on-one) with the patient   N/A    MODALITIES None  Therapy Rationale: Other: pt deferred vasopneumatic compression; has one at home                Assessment (response to treatment):   Arlo is progressing well needs direction in avoiding using left arm until cleared by MD keep sling on so he doesn't forget.  He has full active elbow flexion at this time.     Plan:Patient requires continued skilled care to meet goals.      06/18/2018                                    ---      ---   Total Time   Timed Minutes  45 minutes   Total Time  45 minutes        Assessment        Goals    Goal 1:  Patient will demonstrate independence in prescribed HEP with proper form, sets and reps for  safe discharge to an independent program.     Sessions:  16      Goal 2:  Pt will improve FOTO to 68.    Sessions:  16      Goal 3:  Pt will report use of sling for first 4 weeks and adherence to precautions.    Sessions:  4      Goal 4:  Pt will demonstrate same left shoulder AROM into flexion, abduction and extension as right shoulder and report ability to reach to overhead cabinet without discomfort.    Sessions:  16          Goal 5:  Pt will demonstrate left elbow extension AROM 0 degrees to reach for a car door without discomfort.    Sessions:  8      Goal 6:  Pt will demonstrate 5/5 MMT in left UE including biceps without discomfort to lift and carry heavy groceries without compensation.    Sessions:  16      Goal 7:  Pt will demonstrate proper lifting mechanics with resistance to return to bench press and overhead motions without discomfort.    Sessions:  [address] [place]

## 2018-06-22 ENCOUNTER — Inpatient Hospital Stay: Payer: No Typology Code available for payment source

## 2018-06-25 ENCOUNTER — Inpatient Hospital Stay: Payer: No Typology Code available for payment source

## 2018-06-29 ENCOUNTER — Inpatient Hospital Stay: Payer: No Typology Code available for payment source | Attending: Physician Assistant

## 2018-06-29 DIAGNOSIS — X58XXXD Exposure to other specified factors, subsequent encounter: Secondary | ICD-10-CM | POA: Insufficient documentation

## 2018-06-29 DIAGNOSIS — S46192D Other injury of muscle, fascia and tendon of long head of biceps, left arm, subsequent encounter: Secondary | ICD-10-CM | POA: Insufficient documentation

## 2018-06-29 DIAGNOSIS — M79602 Pain in left arm: Secondary | ICD-10-CM | POA: Insufficient documentation

## 2018-06-29 NOTE — PT/OT Therapy Note (Signed)
Name: Jerry Hickman Age: 63 y.o.   Date of Service: 06/29/2018  Referring Physician: Eulis Manly, PA   Date of Injury: 05/21/2018  Date Care Plan Established/Reviewed: 06/03/2018  Date Treatment Started: 06/03/2018  End of Certification Date: 08/31/2018  Sessions in Plan of Care: 16  Surgery Date: 05/21/2018    Visit Count: 6   Diagnosis:   1. Other injury of muscle, fascia and tendon of long head of biceps, left arm, subsequent encounter    2. Arm pain, anterior, left               Precautions: No data was found  Allergies: Patient has no known allergies.    Subjective:  Jerry Hickman reports he saw Dr. Veneta Penton who was pleased he said no strengthening for another month but I am able to perform AROM and isometrics.    Functional Status: stable post-op 05/21/2018 from left shoulder arthroscopy with debridement and SAD, DCE, open subpectoral biceps tenodesis.     OBJECTIVE: Current Measurements (ROM, Strength, Girth, Outcomes, etc.):    Range of Motion     IE   Left AROM IE Left PROM   Left PROM  06/15/2018   Shoulder IE Right AROM   Right AROM IE Right PROM   Right PROM     130 145 Flexion 165          Extension 53        80 95 Abduction 175        63 69 Internal Rotation FIR TV9  65      54  55 External Rotation FER TV3  103    (blank fields were intentionally left blank)    IE   Left AROM IE   Left PROM   Elbow/Wrist IE   Right AROM IE   Right PROM     134  Elbow Flexion 140        16  Elbow Extension 0          Forearm Supination           Forearm Pronation           Wrist Flexion           Wrist Extension           Radial Deviation           Ulnar Deviation       (blank fields were intentionally left blank)        Strength     IE   Left Strength  Shoulder  MMT IE   Right     Neck Lateral Flexion        Shoulder Flexion 5      Shoulder Extension        Shoulder Abduction 5      Shoulder IR 5       Shoulder ER 5      Serratus       Rhomboids       Upper Trapezius       Middle Trapezius       Lower Trapezius       Biceps 5      Triceps 5    (blank fields were intentionally left blank)      THERAPEUTIC EXERCISE (To improve or develop  Flexibility/ROM,  Strength  and endurance)     Warm-up-  Subjective obtained to assist with planning today's visit  Modifications/Patient Education: Progressed exercises and HEP per exercise flow sheet Cuing (Verbal, Visual  and Tactile cues).  Exercise Flow Sheet  Exercise Specifics 06/08/18 06/10/2018 06/15/2018 06/18/2018 06/29/2018        Pendulums   Flex/ext, abd/add, cw/ccw 2' ea  AC KG KG KG KG        S/L scapular retraction and depression NMR c heavy therapist cueing 10x5" in each direction  AC NMR  KG NMR  KG NMR  KG         Supine elbow AROM flex/ext   Forearm supination and in pronation 12x3" ea  AC KG Kg KG         Seated scap retraction    10x5" B  AC Standing  10x5"  KG Standing  KG KG Standing  20x  KG        Seated backward shoulder circles    15x B  AC Standing  15x  KG KG KG         Seated isometrics   L shoulder ext, abd, add, IR and ER sub-maximal  NMR Against PT resistance 25-50% effort  ~5x5" ea  AC + flexion  5x5"  KG   KG KG At doorway  IR/ER/flexion/extension  10 secx10  KG        Digiflex   green 15x ea digit  AC KG Blue digiflex 1 min  Blue putty  1 min  KG KG horizontal add stretch  3x30"  KG        Forearm supination/pronation   seated Elbow supported, first 0# 10x ea, then 2# wt in hand 15x ea  AC   KG KG         UT stretch   seated 2x30" ea  AC    shoudler IR and extensionwith cane  10x  KG        Triceps press   Isometric, elbow in 90 deg flexion  5"x5  KG KG KG Standing AROM flexion with cane  20x  KG                        Home Exercise Program    Updated HEP    Updated per Med[employer]        (all exercises supervised by clinician unless noted otherwise)   Home exercise program:         MANUAL THERAPY ( To improve joint  mobility, soft tissue mobility, and reduce trigger points)  Pt supine: STM left biceps, pectoralis major and minor; Gr 1-2 L GH jt posterior and inferior glides.        NEUROMUSCULAR RE-ED (For activation and/or inhibition of target muscle,- for movement, balance, coordination, kinesthetic sense, posture, and/ or proprioception for sitting and/ or standing activities  Cueing for scapular positioning (see flowsheet marked NMR)  Access Code: [email or web address]   URL: [email or web address]   Date: 06/29/2018   Prepared by: Barton Fanny     Exercises     Seated Scapular Retraction - 10 reps - 2 sets - 5 seconds hold - 3x daily - 7x weekly     Seated Shoulder Shrug Circles AROM Backward - 10 reps - 1 sets - 1 hold - 3x daily - 7x weekly     Horizontal Shoulder Pendulum with Table Support - 10 reps - 2 sets - 1 hold - 2x daily - 7x weekly     Standing Shoulder Posterior Capsule Stretch - 10 reps - 3 sets - 1x daily - 7x weekly     Standing Shoulder Internal Rotation Stretch with Dowel -  10 reps - 3 sets - 1x daily - 7x weekly     Standing Elbow Flexion Extension AROM - 10 reps - 2 sets - 1 hold - 2x daily - 7x weekly     Standing Bilateral Shoulder Internal Rotation AAROM with Dowel - 10 reps - 3 sets - 1x daily - 7x weekly     Supine Shoulder Flexion with Dowel - 10 reps - 3 sets - 1x daily - 7x weekly     Supine Shoulder External Rotation with Dowel - 10 reps - 3 sets - 1x daily - 7x weekly     Standing Isometric Shoulder Internal Rotation at Doorway - 1 reps - 10 sets - 10 seoncs hold - 1x daily - 7x weekly     Isometric Shoulder External Rotation at Wall - 1 reps - 10 sets - 10 seconds hold - 1x daily - 7x weekly     Isometric Shoulder Flexion at Wall - 1 reps - 10 sets - 10 seconds hold - 1x daily - 7x weekly     Isometric Shoulder Extension at Wall - 1 reps - 10 sets - 10 seocnds hold - 1x daily - 7x weekly    THERAPEUTIC ACTIVITY: (For improvement in the completion of ADL's and functional  activities.  Dynamic activities to improve functional performance, direct (one-on-one) with the patient   N/A    MODALITIES None  Therapy Rationale: Other: pt deferred vasopneumatic compression; has one at home                Assessment (response to treatment):   Jerry Hickman with good understanding of new isometric and AROM with cane exercises.      Plan:Patient has updated HEP and with current COVID virus would like to continue PT 1x/week for next few weeks.       06/29/2018                                    ---      ---   Total Time   Timed Minutes  50 minutes   Total Time  50 minutes        Assessment        Goals    Goal 1:  Patient will demonstrate independence in prescribed HEP with proper form, sets and reps for safe discharge to an independent program.     Sessions:  16      Goal 2:  Pt will improve FOTO to 68.    Sessions:  16      Goal 3:  Pt will report use of sling for first 4 weeks and adherence to precautions.    Sessions:  4      Goal 4:  Pt will demonstrate same left shoulder AROM into flexion, abduction and extension as right shoulder and report ability to reach to overhead cabinet without discomfort.    Sessions:  16          Goal 5:  Pt will demonstrate left elbow extension AROM 0 degrees to reach for a car door without discomfort.    Sessions:  8      Goal 6:  Pt will demonstrate 5/5 MMT in left UE including biceps without discomfort to lift and carry heavy groceries without compensation.    Sessions:  16      Goal 7:  Pt will demonstrate proper lifting mechanics with resistance to return  to bench press and overhead motions without discomfort.    Sessions:  [address], [place]

## 2018-07-07 ENCOUNTER — Telehealth: Payer: Self-pay

## 2018-07-07 ENCOUNTER — Inpatient Hospital Stay: Payer: No Typology Code available for payment source

## 2018-07-12 ENCOUNTER — Other Ambulatory Visit (INDEPENDENT_AMBULATORY_CARE_PROVIDER_SITE_OTHER): Payer: Self-pay | Admitting: Family Medicine

## 2018-07-12 DIAGNOSIS — K219 Gastro-esophageal reflux disease without esophagitis: Secondary | ICD-10-CM

## 2018-07-14 ENCOUNTER — Inpatient Hospital Stay: Payer: No Typology Code available for payment source | Attending: Physician Assistant

## 2018-07-14 ENCOUNTER — Inpatient Hospital Stay: Payer: No Typology Code available for payment source

## 2018-07-14 DIAGNOSIS — S46192D Other injury of muscle, fascia and tendon of long head of biceps, left arm, subsequent encounter: Secondary | ICD-10-CM | POA: Insufficient documentation

## 2018-07-14 DIAGNOSIS — M79602 Pain in left arm: Secondary | ICD-10-CM | POA: Insufficient documentation

## 2018-07-14 NOTE — Progress Notes (Signed)
Name:Jerry Hickman Age: 63 y.o.   Date of Service: 07/14/2018  Referring Physician: Eulis Manly, PA   Date of Injury: 05/21/2018  Date Care Plan Established/Reviewed: 06/03/2018  Date Treatment Started: 06/03/2018  End of Certification Date: 08/31/2018  Sessions in Plan of Care: 16  Surgery Date: 05/21/2018      Visit Count: 7   Diagnosis:   1. Other injury of muscle, fascia and tendon of long head of biceps, left arm, subsequent encounter    2. Arm pain, anterior, left      Subjective     Social Support/Occupation    Occupation: Production designer, theatre/television/film, desk work    Subjective:  Jerry Hickman reports absolutely no pain. He is walking a lot, 5 miles at a time right now and he has been avoiding lifting heavier than 5 lb. He has no feelings of instability or discomfort, but sometimes when rotating his arm overhead he has pulling in the front of the shoulder at the biceps incision.   Functional Status: stable post-op 05/21/2018 from left shoulder arthroscopy with debridement and SAD, DCE, open subpectoral biceps tenodesis.     OBJECTIVE:  Current Measurements (ROM, Strength, Girth, Outcomes, etc.):    Range of Motion    07/14/2018 Left AROM   Left AROM IE Left PROM   Left PROM  07/14/2018   Shoulder IE Right AROM   Right AROM IE Right PROM   Right PROM   165  130 170 Flexion 165      66    Extension 53      168  80 WNL Abduction 175      FIR TV12  63 75 Internal Rotation FIR TV9  65    FER TV3  54  72 External Rotation FER TV3  103    (blank fields were intentionally left blank)    07/14/2018 Left AROM   Left AROM IE Left PROM   Left PROM   Elbow/Wrist IE   Right AROM IE   Right PROM   138  134  Elbow Flexion 140      0  16  Elbow Extension 0          Forearm Supination           Forearm Pronation           Wrist Flexion           Wrist Extension           Radial Deviation           Ulnar Deviation       (blank fields were intentionally left  blank)        Strength    IE   Left Strength  Shoulder  MMT IE   Right     Neck Lateral Flexion       Shoulder Flexion 5      Shoulder Extension       Shoulder Abduction 5      Shoulder IR 5      Shoulder ER 5      Serratus       Rhomboids       Upper Trapezius       Middle Trapezius       Lower Trapezius       Biceps 5      Triceps 5    (blank fields were intentionally left blank)         Precautions: No data was found  Allergies: Patient has no  known allergies.    Past Medical History:   Diagnosis Date    Abnormal vision     reading    Arthritis     L hip    Avascular necrosis     Chronic hip pain     Depression screening negative 12-01-12    Neg    Difficulty in walking(719.7)     limp .Marland KitchenMarland Kitchenpending surgery    GERD (gastroesophageal reflux disease)     on med    H/O splenomegaly     Denies this condition    Headache following lumbar puncture 2004    s/p spinal tap .... blood patch needed    Hyperlipidemia     on med    Hypertensive disorder     on med    Sarcoid     treated with steroids     Assessment (response to treatment):          Jerry Hickman is 7 weeks 5 days s/p left sub-pectoral biceps tenodesis with debridement and SAD, DCE. He has good full AROM in all directions except for limited external rotation of the left shoulder. Left elbow is full AROM. He is still having difficulty with internal reach. He has good strength in all directions though not assessed today for goals; he is still in active range of motion and isometrics phase of protocol and is doing very well with isometrics. He requires cueing to engage scapular retraction and protraction appropriately for exercise. Requesting MD follow-up to determine appropriateness to progress to strengthening phase and update prescription for physical therapy.     Plan:Follow up with MD then continue per updated prescription with same POC; due to COVID virus pt would like to continue PT 1x/week for next  few weeks.                      ---      ---   Total Time   Timed Minutes  60 minutes   Total Time  60 minutes           Goals    Goal 1:  Patient will demonstrate independence in prescribed HEP with proper form, sets and reps for safe discharge to an independent program.  Compliant, 07/14/2018, AC   Sessions:  16   Progression:  progressing      Goal 2:  Pt will improve FOTO to 68.   Not yet assessed as of 07/14/2018, AC   Sessions:  16      Goal 3:  Pt will report use of sling for first 4 weeks and adherence to precautions.   07/14/2018, AC   Sessions:  4   Progression:  met      Goal 4:  Pt will demonstrate same left shoulder AROM into flexion, abduction and extension as right shoulder and report ability to reach to overhead cabinet without discomfort.   Met for function and within same range as right shoulder AROM, 07/14/2018, AC   Sessions:  16   Progression:  progressing          Goal 5:  Pt will demonstrate left elbow extension AROM 0 degrees to reach for a car door without discomfort.   07/14/2018, AC   Sessions:  8   Progression:  met      Goal 6:  Pt will demonstrate 5/5 MMT in left UE including biceps without discomfort to lift and carry heavy groceries without compensation.    Sessions:  16  Goal 7:  Pt will demonstrate proper lifting mechanics with resistance to return to bench press and overhead motions without discomfort.    Sessions:  [id number]                   Gray Bernhardt, PT

## 2018-07-14 NOTE — PT/OT Therapy Note (Signed)
Name: Jerry Hickman Age: 63 y.o.   Date of Service: 07/14/2018  Referring Physician: Eulis Manly, PA   Date of Injury: 05/21/2018  Date Care Plan Established/Reviewed: 06/03/2018  Date Treatment Started: 06/03/2018  End of Certification Date: 08/31/2018  Sessions in Plan of Care: 16  Surgery Date: 05/21/2018    Visit Count: 7   Diagnosis:   1. Other injury of muscle, fascia and tendon of long head of biceps, left arm, subsequent encounter    2. Arm pain, anterior, left        Subjective     Social Support/Occupation      OccupationHydrologist, desk work      Precautions: No data was found  Allergies: Patient has no known allergies.    Subjective:  Jerry Hickman reports absolutely no pain. He is walking a lot, 5 miles at a time right now and he has been avoiding lifting heavier than 5 lb. He has no feelings of instability or discomfort, but sometimes when rotating his arm overhead he has pulling in the front of the shoulder at the biceps incision.   Functional Status: stable post-op 05/21/2018 from left shoulder arthroscopy with debridement and SAD, DCE, open subpectoral biceps tenodesis.     OBJECTIVE: Current Measurements (ROM, Strength, Girth, Outcomes, etc.):    Range of Motion     07/14/2018 Left AROM   Left AROM IE Left PROM   Left PROM  07/14/2018   Shoulder IE Right AROM   Right AROM IE Right PROM   Right PROM   165  130 170 Flexion 165      66    Extension 53      168  80 WNL Abduction 175      FIR TV12  63 75 Internal Rotation FIR TV9  65    FER TV3  54  72 External Rotation FER TV3  103    (blank fields were intentionally left blank)    07/14/2018 Left AROM   Left AROM IE Left PROM   Left PROM   Elbow/Wrist IE   Right AROM IE   Right PROM   138  134  Elbow Flexion 140      0  16  Elbow Extension 0          Forearm Supination           Forearm Pronation           Wrist Flexion           Wrist Extension           Radial Deviation            Ulnar Deviation       (blank fields were intentionally left blank)        Strength     IE   Left Strength  Shoulder  MMT IE   Right     Neck Lateral Flexion        Shoulder Flexion 5      Shoulder Extension        Shoulder Abduction 5      Shoulder IR 5      Shoulder ER 5      Serratus       Rhomboids       Upper Trapezius       Middle Trapezius       Lower Trapezius       Biceps 5      Triceps 5    (blank  fields were intentionally left blank)      THERAPEUTIC EXERCISE (To improve or develop  Flexibility/ROM,  Strength  and endurance)     Warm-up-  Subjective obtained to assist with planning today's visit  Modifications/Patient Education: Progressed exercises and HEP per exercise flow sheet Cuing (Verbal, Visual and Tactile cues).  Exercise Flow Sheet  Exercise Specifics 06/08/18 06/10/2018 06/15/2018 06/18/2018 06/29/2018 07/14/2018       Pendulums   Flex/ext, abd/add, cw/ccw 2' ea  AC KG KG KG KG -       S/L scapular retraction and depression NMR c heavy therapist cueing 10x5" in each direction  AC NMR  KG NMR  KG NMR  KG  S/L shoulder ER c towel under side 2x10  AC       Supine elbow AROM flex/ext   Forearm supination and in pronation 12x3" ea  AC KG Kg KG  Prone L shoulder T's, I's, Y's and W's 2x10 each cueing for scap retraction/depression  AC       Seated scap retraction    10x5" B  AC Standing  10x5"  KG Standing  KG KG Standing  20x  KG Wall plus and negatives c shoulder blades 10x; switched to standing in front of mirror c side view cueing protraction vs retraction 20x  AC        Seated backward shoulder circles    15x B  AC Standing  15x  KG KG KG  Standing B shoulders flexion and scaption c mirror 20x ea  AC       Seated isometrics   L shoulder ext, abd, add, IR and ER sub-maximal  NMR Against PT resistance 25-50% effort  ~5x5" ea  AC + flexion  5x5"  KG   KG KG At doorway  IR/ER/flexion/extension  10 secx10  KG Elbow flexion and extension  isometrics 1x10x6" each, standing c elbow at 90 deg flexion  AC       Digiflex   green 15x ea digit  AC KG Blue digiflex 1 min  Blue putty  1 min  KG KG horizontal add stretch  3x30"  KG Posterior capsule stretch 3x30"  AC       Forearm supination/pronation   seated Elbow supported, first 0# 10x ea, then 2# wt in hand 15x ea  AC   KG KG  HBH supine pec stretch 3x30" B  AC       UT stretch   seated 2x30" ea  AC    shoudler IR and extensionwith cane  10x  KG -       Triceps press   Isometric, elbow in 90 deg flexion  5"x5  KG KG KG Standing AROM flexion with cane  20x  KG -                       Home Exercise Program    Updated HEP    Updated per Med[employer] Updated HEP  AC       (all exercises supervised by clinician unless noted otherwise)   Home exercise program:   Pt's email address: [email or web address]  Access Code: [email or web address]   URL: [email or web address]   Date: 07/14/2018   Prepared by: Gray Bernhardt     Exercises   Seated Shoulder Shrug Circles AROM Backward - 10 reps - 1 sets - 1 hold - 3x daily - 7x weekly   Seated Scapular Retraction - 10 reps - 2 sets - 6 seconds hold - 2x  daily - 7x weekly   Standing Shoulder Posterior Capsule Stretch - 3 reps - 1 sets - 2x daily - 7x weekly   Supine Chest Stretch with Elbows Bent - 10 reps - 2 sets - 1 hold - 1x daily - 7x weekly   Standing Bilateral Shoulder Internal Rotation AAROM with Dowel - 10 reps - 3 sets - 2x daily - 7x weekly   Standing Isometric Shoulder Internal Rotation at Doorway - 10 reps - 2 sets - 6 seoncs hold - 1x daily - 7x weekly   Isometric Shoulder External Rotation at Wall - 10 reps - 2 sets - 6 seconds hold - 1x daily - 7x weekly   Isometric Shoulder Abduction at Wall - 10 reps - 2 sets - 6 seconds hold - 1x daily - 7x weekly   Seated Isometric Elbow Flexion - 10 reps - 2 sets - 6 seconds hold - 1x daily - 7x weekly   Seated Isometric Elbow Extension - 10 reps - 2 sets - 6 seconds hold - 1x daily - 7x weekly   Standing Shoulder Scaption - 10  reps - 2 sets - 1 hold - 1x daily - 6x weekly   Standing Shoulder Flexion Full Range - 10 reps - 2 sets - 1 hold - 1x daily - 6x weekly   Prone Shoulder Horizontal Abduction - 10 reps - 2 sets - 1 hold - 1x daily - 6x weekly   Prone Shoulder Extension - Single Arm - 10 reps - 2 sets - 1 hold - 1x daily - 6x weekly   Prone W Scapular Retraction - 10 reps - 2 sets - 1 hold - 1x daily - 6x weekly           MANUAL THERAPY ( To improve joint mobility, soft tissue mobility, and reduce trigger points)  Pt supine: STM left around incision and biceps, pectoralis major and minor; Gr 1-2 L GH jt posterior and inferior glides.        NEUROMUSCULAR RE-ED (For activation and/or inhibition of target muscle,- for movement, balance, coordination, kinesthetic sense, posture, and/ or proprioception for sitting and/ or standing activities  Cueing for scapular positioning (see flowsheet marked NMR)  -Rhythmic stabilization NMR: in supine, shoulder in 90 deg flexion, 60 deg flexion and 45 deg flexion; sh in 45 deg abduction and 90 deg abduction   -Standing elbow flexion and extension c YTB light resistance <2 lb. cueing for eccentric and concentric control 15x ea      THERAPEUTIC ACTIVITY: (For improvement in the completion of ADL's and functional activities.  Dynamic activities to improve functional performance, direct (one-on-one) with the patient   N/A    MODALITIES None               Assessment (response to treatment):   Jerry Hickman is 7 weeks 5 days s/p left sub-pectoral biceps tenodesis with debridement and SAD, DCE. He has good full AROM in all directions except for limited external rotation of the left shoulder. Left elbow is full AROM. He is still having difficulty with internal reach. He has good strength in all directions though not assessed today for goals; he is still in active range of motion and isometrics phase of protocol and is doing very well with isometrics. He requires cueing to engage scapular retraction and protraction  appropriately for exercise. Requesting MD follow-up to determine appropriateness to progress to strengthening phase and update prescription for physical therapy.     Plan:Follow up with  MD then continue per updated prescription with same POC; due to COVID virus pt would like to continue PT 1x/week for next few weeks.       07/14/2018                                    ---      ---   Total Time   Timed Minutes  60 minutes   Total Time  60 minutes        Assessment        Goals    Goal 1:  Patient will demonstrate independence in prescribed HEP with proper form, sets and reps for safe discharge to an independent program.  Compliant, 07/14/2018, AC   Sessions:  16   Progression:  progressing      Goal 2:  Pt will improve FOTO to 68.   Not yet assessed as of 07/14/2018, AC   Sessions:  16      Goal 3:  Pt will report use of sling for first 4 weeks and adherence to precautions.   07/14/2018, AC   Sessions:  4   Progression:  met      Goal 4:  Pt will demonstrate same left shoulder AROM into flexion, abduction and extension as right shoulder and report ability to reach to overhead cabinet without discomfort.   Met for function and within same range as right shoulder AROM, 07/14/2018, AC   Sessions:  16   Progression:  progressing          Goal 5:  Pt will demonstrate left elbow extension AROM 0 degrees to reach for a car door without discomfort.   07/14/2018, AC   Sessions:  8   Progression:  met      Goal 6:  Pt will demonstrate 5/5 MMT in left UE including biceps without discomfort to lift and carry heavy groceries without compensation.    Sessions:  16      Goal 7:  Pt will demonstrate proper lifting mechanics with resistance to return to bench press and overhead motions without discomfort.    Sessions:  [address]                   Gray Bernhardt, PT

## 2018-07-16 ENCOUNTER — Inpatient Hospital Stay: Payer: No Typology Code available for payment source

## 2018-07-21 ENCOUNTER — Telehealth: Payer: Self-pay

## 2018-10-04 ENCOUNTER — Other Ambulatory Visit (INDEPENDENT_AMBULATORY_CARE_PROVIDER_SITE_OTHER): Payer: Self-pay | Admitting: Family Medicine

## 2018-10-04 DIAGNOSIS — K219 Gastro-esophageal reflux disease without esophagitis: Secondary | ICD-10-CM

## 2018-10-04 NOTE — Telephone Encounter (Signed)
Let him know he is due to be seen

## 2018-10-05 ENCOUNTER — Encounter (INDEPENDENT_AMBULATORY_CARE_PROVIDER_SITE_OTHER): Payer: Self-pay

## 2018-10-05 ENCOUNTER — Telehealth: Payer: Self-pay | Admitting: Family Medicine

## 2018-10-05 NOTE — Telephone Encounter (Signed)
Left message to callback and schedule for f/u appt.    Sent mychart message as well.

## 2018-10-05 NOTE — Telephone Encounter (Signed)
Pt scheduled for 10/15/18

## 2018-10-15 ENCOUNTER — Telehealth: Payer: No Typology Code available for payment source | Admitting: Family Medicine

## 2018-10-15 ENCOUNTER — Encounter (INDEPENDENT_AMBULATORY_CARE_PROVIDER_SITE_OTHER): Payer: Self-pay | Admitting: Family Medicine

## 2018-10-15 DIAGNOSIS — I10 Essential (primary) hypertension: Secondary | ICD-10-CM

## 2018-10-15 DIAGNOSIS — Z125 Encounter for screening for malignant neoplasm of prostate: Secondary | ICD-10-CM

## 2018-10-15 DIAGNOSIS — E782 Mixed hyperlipidemia: Secondary | ICD-10-CM

## 2018-10-15 DIAGNOSIS — K219 Gastro-esophageal reflux disease without esophagitis: Secondary | ICD-10-CM

## 2018-10-15 NOTE — Progress Notes (Signed)
Have you seen any specialists/other providers since your last visit with [place]?    Yes, ortho surgeon (Dr. Veneta Penton for rotator cuff surgery)    Arm preference verified?   Yes    The patient is due for colonoscopy, depression screening, shingles vaccine and [facility] care plan letter, advance directive on file

## 2018-10-15 NOTE — Progress Notes (Signed)
Subjective:      Patient ID: Jerry Hickman is a 63 y.o. male.    Chief Complaint:  Chief Complaint   Patient presents with    Hypertension     Follow up        HPI:  HPI   Patient understands that this is a video visit due to the [place] pandemic and agrees to proceed.    The patient presents for follow up of his hypertension.  He last checked it about 3 weeks ago.  The bottom number runs about 88.  He is not sure of what his top number is.    He recently did PT and his blood pressure was fine.    His weight has been pretty steady about 205.  He is now back in the gym and exercising.    His biggest problem is with his joints.   He recently had rotator cuff  Surgery.    He is here for follow up of his reflux.  He is continue to have problems with his reflux if he does not take the medication.  If he takes the medication he does well.    Problem List:  Patient Active Problem List   Diagnosis    History of sarcoidosis    Essential hypertension, benign    Mixed hyperlipidemia    ED (erectile dysfunction)    GERD (gastroesophageal reflux disease)    Localized osteoarthrosis not specified whether primary or secondary, pelvic region and thigh       Current Medications:  Current Outpatient Medications   Medication Sig Dispense Refill    pantoprazole (PROTONIX) 20 MG tablet TAKE 1 TABLET BY MOUTH EVERY DAY 90 tablet 0    valsartan (DIOVAN) 80 MG tablet Take 1 tablet (80 mg total) by mouth daily 90 tablet 3     No current facility-administered medications for this visit.        Allergies:  No Known Allergies    Past Medical History:  Past Medical History:   Diagnosis Date    Abnormal vision     reading    Arthritis     L hip    Avascular necrosis     Chronic hip pain     Depression screening negative 12-01-12    Neg    Difficulty in walking(719.7)     limp .Marland KitchenMarland Kitchenpending surgery    GERD (gastroesophageal reflux disease)     on med    H/O splenomegaly     Denies this condition    Headache following lumbar  puncture 2004    s/p spinal tap .... blood patch needed    Hyperlipidemia     on med    Hypertensive disorder     on med    Sarcoid     treated with steroids       Past Surgical History:  Past Surgical History:   Procedure Laterality Date    ARTHROPLASTY, HIP, TOTAL, ANTERIOR APPROACH, C ARM  03/10/2013    Procedure: ARTHROPLASTY, HIP, TOTAL, ANTERIOR APPROACH, C ARM;  Surgeon: Cynda Familia, MD;  Location: [facility];  Service: Orthopedics;  Laterality: Left;  LEFT HIP THA ANT      COLONOSCOPY  2014    JOINT REPLACEMENT      hip and knee    REPLACEMENT TOTAL KNEE  01-18-11    right    TOTAL HIP ARTHROPLASTY  11-2004    Right       Family History:  Family History   Problem Relation Age of Onset    Cancer Mother         ovarian    Heart attack Father     Heart disease Father     Hyperlipidemia Father     Hypertension Father     Cancer Sister         breast cancer       Social History:  Social History     Socioeconomic History    Marital status: Married     Spouse name: Not on file    Number of children: Not on file    Years of education: Not on file    Highest education level: Not on file   Occupational History    Not on file   Social Needs    Financial resource strain: Not on file    Food insecurity     Worry: Not on file     Inability: Not on file    Transportation needs     Medical: Not on file     Non-medical: Not on file   Tobacco Use    Smoking status: Never Smoker    Smokeless tobacco: Never Used   Substance and Sexual Activity    Alcohol use: Yes     Alcohol/week: 3.3 standard drinks     Types: 3 Glasses of wine per week     Frequency: 2-3 times a week    Drug use: No    Sexual activity: Not on file   Lifestyle    Physical activity     Days per week: Not on file     Minutes per session: Not on file    Stress: Not on file   Relationships    Social connections     Talks on phone: Not on file     Gets together: Not on file     Attends religious service: Not on file     Active  member of club or organization: Not on file     Attends meetings of clubs or organizations: Not on file     Relationship status: Not on file    Intimate partner violence     Fear of current or ex partner: Not on file     Emotionally abused: Not on file     Physically abused: Not on file     Forced sexual activity: Not on file   Other Topics Concern    Not on file   Social History Narrative    The patient is to [place] and has been since 1990, with 2 sons and one girl.  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       The following sections were reviewed this encounter by the provider:        ROS:  Review of Systems   Constitutional: Negative for fatigue.   HENT: Negative for nosebleeds and tinnitus.    Eyes: Negative for visual disturbance.        He wears glasses   Respiratory: Negative for chest tightness and shortness of breath.    Cardiovascular: Negative for chest pain and palpitations.   Neurological: Negative for dizziness, light-headedness and headaches.   Psychiatric/Behavioral: Negative for sleep disturbance. The patient is not nervous/anxious.        Vitals:  There were no vitals taken for this visit.     Objective:     Physical Exam:  Physical Exam  Constitutional:  General: He is not in acute distress.     Appearance: Normal appearance. He is not ill-appearing.   HENT:      Head: Normocephalic and atraumatic.   Eyes:      General:         Right eye: No discharge.         Left eye: No discharge.      Conjunctiva/sclera: Conjunctivae normal.   Pulmonary:      Effort: Pulmonary effort is normal. No respiratory distress.   Skin:     Findings: No rash.   Neurological:      General: No focal deficit present.      Mental Status: He is alert and oriented to person, place, and time.   Psychiatric:         Mood and Affect: Mood normal.         Thought Content: Thought content normal.          Assessment:     1.  Essential hypertension.  He is not really been checking his blood  pressure at home.  However with physical therapy his blood pressures been good.  I will have him continue with the valsartan but I would like him to monitor his blood pressure daily for the next 3 weeks and get those readings to me.  I also like him to come in for some blood work.    2.  Reflux.  This continues to be an issue for him if he goes off of medication.  However if he takes his medication he does not have problems with reflux.  I will have him continue to use it but we again discussed the potential side effects of the PPIs long-term    3.  Arthritis.  He continues to have a lot of joint pain.  He is continue to follow-up with his orthopedist.  I recommend continue to be active but try some glucosamine chondroitin    4.  Health maintenance.  He is due for colonoscopy and I will send him an order.  He has not yet had the shingles shot and I recommend he do this.  Also recommend he follow-up with me for full physical as well as blood work.    Plan:     1.  Continue his current medications  2.  Monitor his blood pressure daily and get those readings to me in about 3 weeks  3.  Return for fasting labs to include CBC, CMP, lipids  4.  PSA for prostate cancer screening.  We discussed the pros and cons of this.  He would like to get this done.  5.  Shingles shot at local pharmacy  6.  Colonoscopy  7.  Trial of glucosamine chondroitin  8.  Follow-up with me for a full physical in about 3 months.    Chrisandra Carota, MD

## 2018-10-30 ENCOUNTER — Other Ambulatory Visit (INDEPENDENT_AMBULATORY_CARE_PROVIDER_SITE_OTHER): Payer: Self-pay | Admitting: Family Medicine

## 2018-10-30 DIAGNOSIS — E782 Mixed hyperlipidemia: Secondary | ICD-10-CM

## 2018-11-06 ENCOUNTER — Other Ambulatory Visit: Payer: No Typology Code available for payment source

## 2018-11-12 ENCOUNTER — Other Ambulatory Visit (INDEPENDENT_AMBULATORY_CARE_PROVIDER_SITE_OTHER): Payer: Self-pay | Admitting: Family Medicine

## 2018-11-12 DIAGNOSIS — E782 Mixed hyperlipidemia: Secondary | ICD-10-CM

## 2018-11-12 DIAGNOSIS — K219 Gastro-esophageal reflux disease without esophagitis: Secondary | ICD-10-CM

## 2018-11-12 NOTE — Telephone Encounter (Signed)
He still needs to get his blood work done.

## 2018-11-16 NOTE — Telephone Encounter (Signed)
Pt scheduled  

## 2018-11-16 NOTE — Telephone Encounter (Signed)
Left message to callback to schedule for recommended lab appt.

## 2018-11-19 ENCOUNTER — Other Ambulatory Visit (INDEPENDENT_AMBULATORY_CARE_PROVIDER_SITE_OTHER): Payer: Self-pay | Admitting: Family Medicine

## 2018-11-19 DIAGNOSIS — I10 Essential (primary) hypertension: Secondary | ICD-10-CM

## 2018-11-20 ENCOUNTER — Other Ambulatory Visit (FREE_STANDING_LABORATORY_FACILITY): Payer: No Typology Code available for payment source

## 2018-11-20 DIAGNOSIS — I10 Essential (primary) hypertension: Secondary | ICD-10-CM

## 2018-11-20 DIAGNOSIS — Z125 Encounter for screening for malignant neoplasm of prostate: Secondary | ICD-10-CM

## 2018-11-20 DIAGNOSIS — E782 Mixed hyperlipidemia: Secondary | ICD-10-CM

## 2018-11-20 LAB — COMPREHENSIVE METABOLIC PANEL
ALT: 76 U/L — ABNORMAL HIGH (ref 0–55)
AST (SGOT): 46 U/L — ABNORMAL HIGH (ref 5–34)
Albumin/Globulin Ratio: 1.5 (ref 0.9–2.2)
Albumin: 4.3 g/dL (ref 3.5–5.0)
Alkaline Phosphatase: 63 U/L (ref 38–106)
Anion Gap: 11 (ref 5.0–15.0)
BUN: 16 mg/dL (ref 9.0–28.0)
Bilirubin, Total: 0.4 mg/dL (ref 0.2–1.2)
CO2: 24 mEq/L (ref 21–29)
Calcium: 9.2 mg/dL (ref 8.5–10.5)
Chloride: 107 mEq/L (ref 100–111)
Creatinine: 1.2 mg/dL (ref 0.5–1.5)
Globulin: 2.8 g/dL (ref 2.0–3.7)
Glucose: 101 mg/dL — ABNORMAL HIGH (ref 70–100)
Potassium: 4.6 mEq/L (ref 3.5–5.1)
Protein, Total: 7.1 g/dL (ref 6.0–8.3)
Sodium: 142 mEq/L (ref 136–145)

## 2018-11-20 LAB — CBC AND DIFFERENTIAL
Absolute NRBC: 0 10*3/uL (ref 0.00–0.00)
Basophils Absolute Automated: 0.04 10*3/uL (ref 0.00–0.08)
Basophils Automated: 1.1 %
Eosinophils Absolute Automated: 0.07 10*3/uL (ref 0.00–0.44)
Eosinophils Automated: 1.9 %
Hematocrit: 44.7 % (ref 37.6–49.6)
Hgb: 14.7 g/dL (ref 12.5–17.1)
Immature Granulocytes Absolute: 0.01 10*3/uL (ref 0.00–0.07)
Immature Granulocytes: 0.3 %
Lymphocytes Absolute Automated: 1.4 10*3/uL (ref 0.42–3.22)
Lymphocytes Automated: 37.3 %
MCH: 31.7 pg (ref 25.1–33.5)
MCHC: 32.9 g/dL (ref 31.5–35.8)
MCV: 96.3 fL — ABNORMAL HIGH (ref 78.0–96.0)
MPV: 11.2 fL (ref 8.9–12.5)
Monocytes Absolute Automated: 0.3 10*3/uL (ref 0.21–0.85)
Monocytes: 8 %
Neutrophils Absolute: 1.93 10*3/uL (ref 1.10–6.33)
Neutrophils: 51.4 %
Nucleated RBC: 0 /100 WBC (ref 0.0–0.0)
Platelets: 163 10*3/uL (ref 142–346)
RBC: 4.64 10*6/uL (ref 4.20–5.90)
RDW: 13 % (ref 11–15)
WBC: 3.75 10*3/uL (ref 3.10–9.50)

## 2018-11-20 LAB — GFR: EGFR: >60

## 2018-11-20 LAB — HEMOLYSIS INDEX: Hemolysis Index: 4 (ref 0–18)

## 2018-11-20 LAB — LIPID PANEL
Cholesterol / HDL Ratio: 3.3
Cholesterol: 221 mg/dL — ABNORMAL HIGH (ref 0–199)
HDL: 68 mg/dL (ref 40–9999)
LDL Calculated: 119 mg/dL — ABNORMAL HIGH (ref 0–99)
Triglycerides: 169 mg/dL — ABNORMAL HIGH (ref 34–149)
VLDL Calculated: 34 mg/dL (ref 10–40)

## 2018-11-20 LAB — PSA: Prostate Specific Antigen, Total: 0.521 ng/mL (ref 0.000–4.000)

## 2018-11-23 ENCOUNTER — Other Ambulatory Visit (INDEPENDENT_AMBULATORY_CARE_PROVIDER_SITE_OTHER): Payer: Self-pay | Admitting: Family Medicine

## 2018-11-23 DIAGNOSIS — R748 Abnormal levels of other serum enzymes: Secondary | ICD-10-CM

## 2018-12-09 ENCOUNTER — Other Ambulatory Visit (FREE_STANDING_LABORATORY_FACILITY): Payer: No Typology Code available for payment source

## 2018-12-09 DIAGNOSIS — R748 Abnormal levels of other serum enzymes: Secondary | ICD-10-CM

## 2018-12-09 LAB — HEPATIC FUNCTION PANEL
ALT: 77 U/L — ABNORMAL HIGH (ref 0–55)
AST (SGOT): 53 U/L — ABNORMAL HIGH (ref 5–34)
Albumin/Globulin Ratio: 1.7 (ref 0.9–2.2)
Albumin: 4.2 g/dL (ref 3.5–5.0)
Alkaline Phosphatase: 65 U/L (ref 38–106)
Bilirubin Direct: 0.3 mg/dL (ref 0.0–0.5)
Bilirubin Indirect: 0.5 mg/dL (ref 0.2–1.0)
Bilirubin, Total: 0.8 mg/dL (ref 0.2–1.2)
Globulin: 2.5 g/dL (ref 2.0–3.7)
Protein, Total: 6.7 g/dL (ref 6.0–8.3)

## 2018-12-09 LAB — HEMOLYSIS INDEX: Hemolysis Index: 7 (ref 0–18)

## 2018-12-15 ENCOUNTER — Other Ambulatory Visit (INDEPENDENT_AMBULATORY_CARE_PROVIDER_SITE_OTHER): Payer: Self-pay | Admitting: Family Medicine

## 2018-12-15 DIAGNOSIS — R748 Abnormal levels of other serum enzymes: Secondary | ICD-10-CM

## 2018-12-30 ENCOUNTER — Ambulatory Visit
Admission: RE | Admit: 2018-12-30 | Discharge: 2018-12-30 | Disposition: A | Discharge status: Home / Self Care | Payer: No Typology Code available for payment source | Source: Ambulatory Visit | Attending: Family Medicine | Admitting: Family Medicine

## 2018-12-30 DIAGNOSIS — K760 Fatty (change of) liver, not elsewhere classified: Secondary | ICD-10-CM | POA: Insufficient documentation

## 2018-12-30 DIAGNOSIS — R748 Abnormal levels of other serum enzymes: Secondary | ICD-10-CM | POA: Insufficient documentation

## 2019-02-10 ENCOUNTER — Other Ambulatory Visit (INDEPENDENT_AMBULATORY_CARE_PROVIDER_SITE_OTHER): Payer: Self-pay | Admitting: Family Medicine

## 2019-02-10 DIAGNOSIS — I10 Essential (primary) hypertension: Secondary | ICD-10-CM

## 2019-02-10 NOTE — Telephone Encounter (Signed)
Let him know he is due for follow up due to his fatty liver and increased liver enzymes

## 2019-02-10 NOTE — Telephone Encounter (Signed)
LVM for pt to return call

## 2019-02-12 NOTE — Telephone Encounter (Signed)
LVM x2

## 2019-02-12 NOTE — Telephone Encounter (Signed)
Spoke to pt, advised of information and scheduled for LFT 02/17/2019 @ 10:30am.

## 2019-02-12 NOTE — Telephone Encounter (Signed)
Patient is returning call from [place]. Please return call at noted phone number below.      Patient Preferred Callback Number: [phone]

## 2019-02-16 ENCOUNTER — Other Ambulatory Visit (INDEPENDENT_AMBULATORY_CARE_PROVIDER_SITE_OTHER): Payer: Self-pay | Admitting: Family Medicine

## 2019-02-16 DIAGNOSIS — R748 Abnormal levels of other serum enzymes: Secondary | ICD-10-CM

## 2019-02-17 ENCOUNTER — Other Ambulatory Visit (FREE_STANDING_LABORATORY_FACILITY): Payer: No Typology Code available for payment source

## 2019-02-17 DIAGNOSIS — R748 Abnormal levels of other serum enzymes: Secondary | ICD-10-CM

## 2019-02-17 LAB — HEPATIC FUNCTION PANEL
ALT: 54 U/L (ref 0–55)
AST (SGOT): 42 U/L — ABNORMAL HIGH (ref 5–34)
Albumin/Globulin Ratio: 1.6 (ref 0.9–2.2)
Albumin: 4.1 g/dL (ref 3.5–5.0)
Alkaline Phosphatase: 62 U/L (ref 38–106)
Bilirubin Direct: 0.4 mg/dL (ref 0.0–0.5)
Bilirubin Indirect: 0.5 mg/dL (ref 0.2–1.0)
Bilirubin, Total: 0.9 mg/dL (ref 0.2–1.2)
Globulin: 2.6 g/dL (ref 2.0–3.7)
Protein, Total: 6.7 g/dL (ref 6.0–8.3)

## 2019-02-17 LAB — HEMOLYSIS INDEX: Hemolysis Index: 8 (ref 0–18)

## 2019-03-14 ENCOUNTER — Other Ambulatory Visit (INDEPENDENT_AMBULATORY_CARE_PROVIDER_SITE_OTHER): Payer: Self-pay | Admitting: Family Medicine

## 2019-03-14 DIAGNOSIS — K219 Gastro-esophageal reflux disease without esophagitis: Secondary | ICD-10-CM

## 2019-03-14 DIAGNOSIS — I10 Essential (primary) hypertension: Secondary | ICD-10-CM

## 2019-03-14 DIAGNOSIS — E782 Mixed hyperlipidemia: Secondary | ICD-10-CM

## 2019-05-11 ENCOUNTER — Other Ambulatory Visit (INDEPENDENT_AMBULATORY_CARE_PROVIDER_SITE_OTHER): Payer: Self-pay | Admitting: Family Medicine

## 2019-05-11 DIAGNOSIS — K219 Gastro-esophageal reflux disease without esophagitis: Secondary | ICD-10-CM

## 2019-05-11 DIAGNOSIS — I10 Essential (primary) hypertension: Secondary | ICD-10-CM

## 2019-05-11 DIAGNOSIS — E782 Mixed hyperlipidemia: Secondary | ICD-10-CM

## 2019-05-11 NOTE — Telephone Encounter (Signed)
Let him know he is due to be seen and due for a physical.

## 2019-05-12 ENCOUNTER — Encounter (INDEPENDENT_AMBULATORY_CARE_PROVIDER_SITE_OTHER): Payer: Self-pay

## 2019-05-12 NOTE — Telephone Encounter (Signed)
Message sent to pt via portal with update and to return call to schedule appointment.

## 2019-06-21 ENCOUNTER — Ambulatory Visit (INDEPENDENT_AMBULATORY_CARE_PROVIDER_SITE_OTHER): Payer: No Typology Code available for payment source

## 2019-06-21 ENCOUNTER — Other Ambulatory Visit (INDEPENDENT_AMBULATORY_CARE_PROVIDER_SITE_OTHER): Payer: Self-pay | Admitting: Family Medicine

## 2019-06-21 DIAGNOSIS — Z23 Encounter for immunization: Secondary | ICD-10-CM

## 2019-06-21 NOTE — Progress Notes (Signed)
COVID-19 Vaccination:  Jerry Hickman   1956-03-24  06/21/2019     Patient presented to the office for the following COVID-19 vaccine administration:    [x]  Jansen/J+J (single dose)  []  Pfizer-BioNTech  []  Moderna      []  Dose #1   []  Dose #2   (Both doses of the series should be completed with the same product.)    [x]  A verbal consent has been obtained from patient to administer COVID-19 vaccine that is currently being offered under Emergency Use Authorization (EUA). Patient/Caregiver has been provided a copy of EUA.      [x]  Pt has not received other non-COVID vaccinations within 14 days of administration of COVID-19 vaccination.  [x]  Pt was screened for precautions and contraindications to vaccine  []  24-28+ day interval observed between first and second dose (Moderna)  []  17-21+ day interval observed between first and second dose (Pfizer-BioNTech)        Patient presented to the office for COVID Vaccine Jerry Hickman) administration.  Received injection in the Right arm.  No reaction was noted and patient left in good condition.       COVID-19 Post-Vaccination Observation:  Jerry Hickman   1955-04-10  06/21/2019     Pt was observed post-vaccination for the occurrence of immediate adverse reactions:   [x]  15 minutes: No prior history of significant allergic reaction to vaccine or injectable therapy or history of anaphylaxis due to any cause   []  30 minutes: Prior history of an immediate allergic reaction of any severity to a vaccine or injectable therapy (urticaria, angioedema, respiratory distress (e.g., wheezing, stridor))  []  30 minutes: Prior history of anaphylaxis due to any cause     Post-Vaccination Observation:  [x]  No reaction was noted and patient left in good condition.  []  Arm Soreness  []  Erythema  []  Hives  []  Other:

## 2019-08-12 ENCOUNTER — Other Ambulatory Visit (INDEPENDENT_AMBULATORY_CARE_PROVIDER_SITE_OTHER): Payer: Self-pay | Admitting: Family Medicine

## 2019-08-12 DIAGNOSIS — K219 Gastro-esophageal reflux disease without esophagitis: Secondary | ICD-10-CM

## 2019-08-12 DIAGNOSIS — E782 Mixed hyperlipidemia: Secondary | ICD-10-CM

## 2019-08-12 DIAGNOSIS — I10 Essential (primary) hypertension: Secondary | ICD-10-CM

## 2019-08-12 NOTE — Telephone Encounter (Signed)
Left detailed voice message per disclosure form and advised to return call to schedule appointment.

## 2019-08-12 NOTE — Telephone Encounter (Signed)
Please call him and let him know he is due to be seen prior to his next refill

## 2019-08-13 ENCOUNTER — Other Ambulatory Visit: Payer: No Typology Code available for payment source

## 2019-08-13 ENCOUNTER — Ambulatory Visit (INDEPENDENT_AMBULATORY_CARE_PROVIDER_SITE_OTHER): Payer: No Typology Code available for payment source | Admitting: Family Medicine

## 2019-08-13 ENCOUNTER — Encounter (INDEPENDENT_AMBULATORY_CARE_PROVIDER_SITE_OTHER): Payer: Self-pay | Admitting: Family Medicine

## 2019-08-13 ENCOUNTER — Other Ambulatory Visit (INDEPENDENT_AMBULATORY_CARE_PROVIDER_SITE_OTHER): Payer: Self-pay | Admitting: Family Medicine

## 2019-08-13 ENCOUNTER — Other Ambulatory Visit (FREE_STANDING_LABORATORY_FACILITY): Payer: No Typology Code available for payment source

## 2019-08-13 VITALS — BP 130/82 | HR 78 | Temp 97.9°F | Resp 16 | Ht 68.0 in | Wt 245.0 lb

## 2019-08-13 DIAGNOSIS — I10 Essential (primary) hypertension: Secondary | ICD-10-CM

## 2019-08-13 DIAGNOSIS — Z23 Encounter for immunization: Secondary | ICD-10-CM

## 2019-08-13 DIAGNOSIS — K219 Gastro-esophageal reflux disease without esophagitis: Secondary | ICD-10-CM

## 2019-08-13 DIAGNOSIS — R748 Abnormal levels of other serum enzymes: Secondary | ICD-10-CM

## 2019-08-13 DIAGNOSIS — E782 Mixed hyperlipidemia: Secondary | ICD-10-CM

## 2019-08-13 LAB — COMPREHENSIVE METABOLIC PANEL
ALT: 131 U/L — ABNORMAL HIGH (ref 0–55)
AST (SGOT): 87 U/L — ABNORMAL HIGH (ref 5–34)
Albumin/Globulin Ratio: 1.4 (ref 0.9–2.2)
Albumin: 4.2 g/dL (ref 3.5–5.0)
Alkaline Phosphatase: 64 U/L (ref 38–106)
Anion Gap: 9 (ref 5.0–15.0)
BUN: 14 mg/dL (ref 9.0–28.0)
Bilirubin, Total: 0.5 mg/dL (ref 0.2–1.2)
CO2: 25 mEq/L (ref 21–29)
Calcium: 9.2 mg/dL (ref 8.5–10.5)
Chloride: 106 mEq/L (ref 100–111)
Creatinine: 1.2 mg/dL (ref 0.5–1.5)
Globulin: 3 g/dL (ref 2.0–3.7)
Glucose: 96 mg/dL (ref 70–100)
Potassium: 4.7 mEq/L (ref 3.5–5.1)
Protein, Total: 7.2 g/dL (ref 6.0–8.3)
Sodium: 140 mEq/L (ref 136–145)

## 2019-08-13 LAB — CBC AND DIFFERENTIAL
Absolute NRBC: 0 10*3/uL (ref 0.00–0.00)
Basophils Absolute Automated: 0.01 10*3/uL (ref 0.00–0.08)
Basophils Automated: 0.2 %
Eosinophils Absolute Automated: 0.1 10*3/uL (ref 0.00–0.44)
Eosinophils Automated: 2.5 %
Hematocrit: 44.5 % (ref 37.6–49.6)
Hgb: 15.1 g/dL (ref 12.5–17.1)
Immature Granulocytes Absolute: 0.01 10*3/uL (ref 0.00–0.07)
Immature Granulocytes: 0.2 %
Lymphocytes Absolute Automated: 1.44 10*3/uL (ref 0.42–3.22)
Lymphocytes Automated: 35.8 %
MCH: 32.5 pg (ref 25.1–33.5)
MCHC: 33.9 g/dL (ref 31.5–35.8)
MCV: 95.7 fL (ref 78.0–96.0)
MPV: 10.2 fL (ref 8.9–12.5)
Monocytes Absolute Automated: 0.37 10*3/uL (ref 0.21–0.85)
Monocytes: 9.2 %
Neutrophils Absolute: 2.09 10*3/uL (ref 1.10–6.33)
Neutrophils: 52.1 %
Nucleated RBC: 0 /100 WBC (ref 0.0–0.0)
Platelets: 164 10*3/uL (ref 142–346)
RBC: 4.65 10*6/uL (ref 4.20–5.90)
RDW: 13 % (ref 11–15)
WBC: 4.02 10*3/uL (ref 3.10–9.50)

## 2019-08-13 LAB — LIPID PANEL
Cholesterol / HDL Ratio: 3.3
Cholesterol: 214 mg/dL — ABNORMAL HIGH (ref 0–199)
HDL: 64 mg/dL (ref 40–9999)
LDL Calculated: 122 mg/dL — ABNORMAL HIGH (ref 0–99)
Triglycerides: 142 mg/dL (ref 34–149)
VLDL Calculated: 28 mg/dL (ref 10–40)

## 2019-08-13 LAB — GFR: EGFR: >60

## 2019-08-13 LAB — HEMOLYSIS INDEX: Hemolysis Index: 4 (ref 0–18)

## 2019-08-13 NOTE — Progress Notes (Signed)
Subjective:      Patient ID: Jerry Hickman is a 64 y.o. male.    Chief Complaint:  Chief Complaint   Patient presents with    Hypertension     F/U. Depression screening completed.     Hyperlipidemia     F/U. Currently fasting.        HPI:  HPI    The patient is here today for follow-up of his hypertension.  He is generally doing well   He is really watching his diet.   He is exercising very regularly.   He is drinking on a limited basis just on the week ends.   His pressure at home is running about what it is here.      He is here today for follow-up of his hyperlipidemia.  He is having no problems with his medication    He is taking his protonix regularly every other day.  He is having no problems with his medication.    He has had a colonoscopy in the past year.   He did have some polyps.  Problem List:  Patient Active Problem List   Diagnosis    History of sarcoidosis    Essential hypertension, benign    Mixed hyperlipidemia    ED (erectile dysfunction)    GERD (gastroesophageal reflux disease)    Localized osteoarthrosis not specified whether primary or secondary, pelvic region and thigh       Current Medications:  Current Outpatient Medications   Medication Sig Dispense Refill    atorvastatin (LIPITOR) 20 MG tablet TAKE 1 TABLET BY MOUTH EVERY DAY 90 tablet 0    pantoprazole (PROTONIX) 20 MG tablet TAKE 1 TABLET BY MOUTH EVERY DAY 90 tablet 0    valsartan (DIOVAN) 80 MG tablet TAKE 1 TABLET BY MOUTH EVERY DAY 90 tablet 0     No current facility-administered medications for this visit.       Allergies:  No Known Allergies    Past Medical History:  Past Medical History:   Diagnosis Date    Abnormal vision     reading    Arthritis     L hip    Avascular necrosis     Chronic hip pain     Depression screening negative 12-01-12    Neg    Difficulty in walking(719.7)     limp .Marland KitchenMarland Kitchenpending surgery    GERD (gastroesophageal reflux disease)     on med    H/O splenomegaly     Denies this condition     Headache following lumbar puncture 2004    s/p spinal tap .... blood patch needed    Hyperlipidemia     on med    Hypertensive disorder     on med    Sarcoid     treated with steroids       Past Surgical History:  Past Surgical History:   Procedure Laterality Date    ARTHROPLASTY, HIP, TOTAL, ANTERIOR APPROACH, C ARM  03/10/2013    Procedure: ARTHROPLASTY, HIP, TOTAL, ANTERIOR APPROACH, C ARM;  Surgeon: Cynda Familia, MD;  Location: [facility];  Service: Orthopedics;  Laterality: Left;  LEFT HIP THA ANT      COLONOSCOPY  2014    JOINT REPLACEMENT      hip and knee    REPLACEMENT TOTAL KNEE  01-18-11    right    TOTAL HIP ARTHROPLASTY  11-2004    Right       Family  History:  Family History   Problem Relation Age of Onset    Cancer Mother         ovarian    Heart attack Father     Heart disease Father     Hyperlipidemia Father     Hypertension Father     Cancer Sister         breast cancer       Social History:  Social History     Socioeconomic History    Marital status: Married     Spouse name: Not on file    Number of children: Not on file    Years of education: Not on file    Highest education level: Not on file   Occupational History    Not on file   Tobacco Use    Smoking status: Never Smoker    Smokeless tobacco: Never Used   Substance and Sexual Activity    Alcohol use: Yes     Alcohol/week: 3.3 standard drinks     Types: 3 Glasses of wine per week    Drug use: No    Sexual activity: Not on file   Other Topics Concern    Not on file   Social History Narrative    The patient is to Aggie Cosier and has been since 1990, with 2 sons and one girl.  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     Social Determinants of Health     Financial Resource Strain:     Difficulty of Paying Living Expenses:    Food Insecurity:     Worried About Programme researcher, broadcasting/film/video in the Last Year:     Barista in the Last Year:    Transportation Needs:     Freight forwarder  (Medical):     Lack of Transportation (Non-Medical):    Physical Activity:     Days of Exercise per Week:     Minutes of Exercise per Session:    Stress:     Feeling of Stress :    Social Connections:     Frequency of Communication with Friends and Family:     Frequency of Social Gatherings with Friends and Family:     Attends Religious Services:     Active Member of Clubs or Organizations:     Attends Banker Meetings:     Marital Status:    Intimate Partner Violence:     Fear of Current or Ex-Partner:     Emotionally Abused:     Physically Abused:     Sexually Abused:        The following sections were reviewed this encounter by the provider:   Tobacco   Allergies   Meds   Problems   Med Hx   Surg Hx   Fam Hx          ROS:  Review of Systems   Constitutional: Negative.  Negative for activity change, appetite change, fatigue and unexpected weight change.   HENT: Negative.  Negative for ear pain, hearing loss and trouble swallowing.    Eyes: Negative.  Negative for pain and visual disturbance.   Respiratory: Negative.  Negative for cough, chest tightness and shortness of breath.    Cardiovascular: Negative.  Negative for chest pain and palpitations.   Gastrointestinal: Negative.  Negative for abdominal pain, blood in stool, constipation and diarrhea.   Genitourinary: Negative.  Negative for dysuria, frequency, hematuria and urgency.  Musculoskeletal: Negative.  Negative for arthralgias, myalgias and neck pain.   Skin: Negative.    Neurological: Negative.  Negative for dizziness, tremors, syncope, weakness, numbness and headaches.   Hematological: Negative for adenopathy.   Psychiatric/Behavioral: Negative.  Negative for confusion and dysphoric mood. The patient is not nervous/anxious.        Vitals:  BP 130/82 (BP Site: Left arm, Patient Position: Sitting, Cuff Size: Large)    Pulse 78    Temp 97.9 F (36.6 C) (Temporal)    Resp 16    Ht 1.727 m (5\' 8" )    Wt 111.1 kg (245 lb)    SpO2  98%    BMI 37.25 kg/m      Objective:     Physical Exam:  Physical Exam  Constitutional:       General: He is not in acute distress.     Appearance: He is well-developed. He is not diaphoretic.   HENT:      Head: Normocephalic and atraumatic.      Right Ear: External ear normal.      Left Ear: External ear normal.      Nose: Nose normal.      Mouth/Throat:      Pharynx: No oropharyngeal exudate or posterior oropharyngeal erythema.   Eyes:      General: No scleral icterus.        Right eye: No discharge.         Left eye: No discharge.      Extraocular Movements: Extraocular movements intact.      Conjunctiva/sclera: Conjunctivae normal.      Pupils: Pupils are equal, round, and reactive to light.   Neck:      Thyroid: No thyromegaly.      Vascular: No JVD.   Cardiovascular:      Rate and Rhythm: Normal rate and regular rhythm.      Heart sounds: Normal heart sounds. No murmur. No friction rub. No gallop.    Pulmonary:      Effort: Pulmonary effort is normal. No respiratory distress.      Breath sounds: Normal breath sounds. No wheezing or rales.   Abdominal:      General: Bowel sounds are normal. There is no distension.      Palpations: Abdomen is soft.      Tenderness: There is no abdominal tenderness.   Musculoskeletal:         General: No tenderness. Normal range of motion.      Cervical back: Neck supple.   Lymphadenopathy:      Cervical: No cervical adenopathy.   Skin:     General: Skin is warm and dry.      Findings: No rash.   Neurological:      Mental Status: He is alert and oriented to person, place, and time.      Cranial Nerves: No cranial nerve deficit.   Psychiatric:         Mood and Affect: Mood normal.         Thought Content: Thought content normal.         Blood pressure 130/82, pulse 78, temperature 97.9 F (36.6 C), temperature source Temporal, resp. rate 16, height 1.727 m (5\' 8" ), weight 111.1 kg (245 lb), SpO2 98 %.      Assessment:     1. Essential hypertension. His blood pressure is pretty  much at goal. He is having no trouble with his medication. He is taking it regularly and  without difficulties. He is exercising very vigorously. He is watching his diet although is having hard time with losing weight. He overall feels very good. I will leave him on the same medications and have him continue to work on weight loss. I will check some labs today.    2. Mixed hyperlipidemia. He is taking his atorvastatin without difficulties. He feels well. I will check some labs today and have him continue with diet and exercise    3. Reflux. He is taking pantoprazole every other day. I recommend trying to decrease to every third day and then transition to Pepcid as needed. He will give this a try with me know if he has any problems.    4. Health maintenance. He has not had the shingles shot. I have put an order for this for him. He also has not had a tetanus shot long time. I will update him today. He has had his colonoscopy within the last year and he will get a copy of that to me.    Plan:     1. CBC, CMP, lipids  2. Continue to work on weight loss  3. Heart healthy diet  4. Keep up the good work with exercise  5. Tetanus shot update today  6. Return for the shingles vaccine  7. Copy of his colonoscopy  8. Continue his current medications  9. If all is going well follow-up with me in 6 months for full physical.    Chrisandra Carota, MD

## 2019-08-13 NOTE — Progress Notes (Signed)
Have you seen any specialists/other providers since your last visit with [place]?    Yes  GI for colonoscopy.    Arm preference verified?   Yes    The patient is due for shingrix vaccine.

## 2019-08-17 ENCOUNTER — Encounter (INDEPENDENT_AMBULATORY_CARE_PROVIDER_SITE_OTHER): Payer: Self-pay | Admitting: Registered Nurse

## 2019-08-17 ENCOUNTER — Ambulatory Visit (INDEPENDENT_AMBULATORY_CARE_PROVIDER_SITE_OTHER): Payer: 59 | Admitting: Registered Nurse

## 2019-08-17 VITALS — BP 133/68 | HR 78

## 2019-08-17 DIAGNOSIS — H6121 Impacted cerumen, right ear: Secondary | ICD-10-CM

## 2019-08-17 NOTE — Progress Notes (Signed)
Have you seen any specialists/other providers since your last visit with [place]?    No    Arm preference verified?   Yes    The patient is due for colonoscopy and shingles vaccine       [facility]    PROGRESS NOTE      Patient: Jerry Hickman   Date: 08/17/2019   MRN: [id number]     Past Medical History:   Diagnosis Date    Abnormal vision     reading    Arthritis     L hip    Avascular necrosis     Chronic hip pain     Depression screening negative 12-01-12    Neg    Difficulty in walking(719.7)     limp .Marland KitchenMarland Kitchenpending surgery    GERD (gastroesophageal reflux disease)     on med    H/O splenomegaly     Denies this condition    Headache following lumbar puncture 2004    s/p spinal tap .... blood patch needed    Hyperlipidemia     on med    Hypertensive disorder     on med    Sarcoid     treated with steroids     Social History     Socioeconomic History    Marital status: Married     Spouse name: Not on file    Number of children: Not on file    Years of education: Not on file    Highest education level: Not on file   Occupational History    Not on file   Tobacco Use    Smoking status: Never Smoker    Smokeless tobacco: Never Used   Substance and Sexual Activity    Alcohol use: Yes     Alcohol/week: 3.3 standard drinks     Types: 3 Glasses of wine per week    Drug use: No    Sexual activity: Not on file   Other Topics Concern    Not on file   Social History Narrative    The patient is to Jerry Hickman and has been since 1990, with 2 sons and one girl.  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     Social Determinants of Health     Financial Resource Strain:     Difficulty of Paying Living Expenses:    Food Insecurity:     Worried About Programme researcher, broadcasting/film/video in the Last Year:     Barista in the Last Year:    Transportation Needs:     Freight forwarder (Medical):     Lack of Transportation (Non-Medical):    Physical Activity:     Days of Exercise per  Week:     Minutes of Exercise per Session:    Stress:     Feeling of Stress :    Social Connections:     Frequency of Communication with Friends and Family:     Frequency of Social Gatherings with Friends and Family:     Attends Religious Services:     Active Member of Clubs or Organizations:     Attends Engineer, structural:     Marital Status:    Intimate Partner Violence:     Fear of Current or Ex-Partner:     Emotionally Abused:     Physically Abused:     Sexually Abused:      Family History  Problem Relation Age of Onset    Cancer Mother         ovarian    Heart attack Father     Heart disease Father     Hyperlipidemia Father     Hypertension Father     Cancer Sister         breast cancer       ASSESSMENT/PLAN     Jerry Hickman is a 64 y.o. male    Chief Complaint   Patient presents with    Cerumen Impaction        1. Hearing loss of right ear due to cerumen impaction    F/U as needed     No results found for this or any previous visit (from the past 24 hour(s)).      Risk & Benefits of the new medication(s) were explained to the patient (and family) who verbalized understanding & agreed to the treatment plan. Patient (family) encouraged to contact me/clinical staff with any questions/concerns      MEDICATIONS     Outpatient Medications Marked as Taking for the 08/17/19 encounter (Office Visit) with Alinda Money, FNP   Medication Sig Dispense Refill    atorvastatin (LIPITOR) 20 MG tablet TAKE 1 TABLET BY MOUTH EVERY DAY 90 tablet 0    pantoprazole (PROTONIX) 20 MG tablet TAKE 1 TABLET BY MOUTH EVERY DAY 90 tablet 0    valsartan (DIOVAN) 80 MG tablet TAKE 1 TABLET BY MOUTH EVERY DAY 90 tablet 0         No Known Allergies    SUBJECTIVE     Chief Complaint   Patient presents with    Cerumen Impaction        Left ears left clogged.  He wants his ear to be flushed.  He has used over-the-counter Debrox eardrop in the past 3 days.  He has history of impacted ear wax.       ROS      Review of Systems   Constitutional: Negative for activity change, appetite change, chills, fatigue and fever.   HENT: Positive for congestion, ear pain (right side with mild discomfort) and hearing loss. Negative for ear discharge, sinus pain and sore throat.    Respiratory: Negative for cough and shortness of breath.    Cardiovascular: Negative for chest pain.   Gastrointestinal: Negative for nausea and vomiting.   Neurological: Negative for dizziness, syncope, light-headedness and headaches.   Hematological: Negative for adenopathy.       The following sections were reviewed this encounter by the provider:        PHYSICAL EXAM     Vitals:    08/17/19 1310   BP: 133/68   BP Site: Left arm   Patient Position: Sitting   Cuff Size: Large   Pulse: 78       Physical Exam  Vitals and nursing note reviewed.   Constitutional:       General: He is not in acute distress.     Appearance: Normal appearance. He is well-developed. He is not ill-appearing or diaphoretic.   HENT:      Head: Normocephalic.      Right Ear: There is impacted cerumen.      Left Ear: Tympanic membrane, ear canal and external ear normal.      Nose: Nose normal.      Mouth/Throat:      Pharynx: Uvula midline. No oropharyngeal exudate or posterior oropharyngeal erythema.   Eyes:  Conjunctiva/sclera: Conjunctivae normal.      Pupils: Pupils are equal, round, and reactive to light.   Cardiovascular:      Rate and Rhythm: Normal rate and regular rhythm.      Pulses: Normal pulses.      Heart sounds: Normal heart sounds. No murmur.   Pulmonary:      Effort: Pulmonary effort is normal. No respiratory distress.      Breath sounds: Normal breath sounds. No stridor. No wheezing or rhonchi.   Musculoskeletal:      Cervical back: Normal range of motion and neck supple.   Neurological:      Mental Status: He is alert and oriented to person, place, and time.   Psychiatric:         Mood and Affect: Mood normal.         Behavior: Behavior normal.          Thought Content: Thought content normal.         Judgment: Judgment normal.       Ortho Exam  Neurologic Exam     Mental Status   Oriented to person, place, and time.     Cranial Nerves     CN III, IV, VI   Pupils are equal, round, and reactive to light.      PROCEDURE(S)     Ear Wax Removal    Date/Time: 08/17/2019 3:19 PM  Performed by: Alinda Money, FNP  Authorized by: Alinda Money, FNP   Consent: Verbal consent obtained.  Risks and benefits: risks, benefits and alternatives were discussed  Consent given by: patient  Patient understanding: patient states understanding of the procedure being performed  Patient identity confirmed: verbally with patient  Location details: right ear  Patient tolerance: patient tolerated the procedure well with no immediate complications  Comments: TM intact, pt feels better after ear irrigation performed by nurse Morrell Riddle.   Procedure type: irrigation   Sedation:  Patient sedated: no                Efraim Kaufmann, FNP  08/17/2019

## 2019-08-17 NOTE — Patient Instructions (Signed)
Impacted Earwax     Inner ear structures including ear canal and eardrum.   Impacted earwax is a buildup of the natural wax in the ear. Impacted earwax is very common. It can cause symptoms such as hearing loss. It can also make it hard for a healthcare provider to check your ear.   Understanding earwax  Tiny glands in your ear make substances that combine with dead skin cells to form earwax. Earwax helps protect your ear canal from water, dirt, infection, and injury. Over time, earwax travels from the inner part of your ear canal to the entrance of the canal. Then it falls away naturally. But in some cases, it can’t travel to the entrance of the canal. This may be because of a health condition or objects put in the ear. With age, earwax tends to become harder and less fluid. Older adults are more likely to have problems with earwax buildup.   What causes impacted earwax?  Earwax can build up because of many health conditions. Some cause a physical blockage. Others cause too much earwax to be made. Health conditions that can cause earwax buildup include:   · Bony blockage in the ear (osteoma or exostoses)  · Infections, such as an outer ear infection (external otitis)  · Skin disease, such as eczema  · Autoimmune diseases, such as lupus  · A narrowed ear canal from birth, chronic inflammation, or injury  · Too much earwax because of injury  · Too much earwax because of  water in the ear canal  Putting objects in the ear again and again can also cause impacted earwax. For example, putting cotton swabs in the ear may push the wax deeper into the ear. Over time, this may cause blockage. Hearing aids, swimming plugs, and swim molds can also cause this problem when used again and again.   In some cases, the cause of impacted earwax is not known.  Symptoms of impacted earwax  Excess earwax often does not cause any symptoms, unless there is a large amount of buildup. Then it may cause symptoms such as:   · Hearing  loss  · Earache  · Sense of ear fullness  · Itching in the ear  · Odor from the ear  · Ear drainage  · Dizziness  · Ringing in the ears  · Cough  Treatment for impacted earwax  If you don’t have symptoms, you may not need treatment. Often the earwax goes away on its own with time. If you have symptoms, you may have 1 or more treatments such as:   · Ear drops to soften the earwax. This helps it leave the ear over time.  · Rinsing the ear canal with water. This is done in a healthcare provider’s office.  · Removing the earwax with small tools. This is also done in a provider’s office.  In rare cases, some treatments for earwax removal may cause complications such as:  · Outer ear infection  · Earache  · Short-term hearing loss  · Dizziness  · Water trapped in the ear canal  · Hole in the eardrum  · Ringing in the ears  · Bleeding from the ear  Talk with your healthcare provider about which risks apply most to you.  Healthcare providers don't advise using ear candles or ear vacuum kits. These methods are not shown to work and may cause problems.   Preventing impacted earwax  You may not be able to prevent impacted earwax if you have   a health condition that causes it, such as eczema. In other cases, you may be able to prevent earwax buildup by:   · Using ear drops once a week  · Having a regular ear cleaning about every 6 months  · Not using cotton swabs in the ear  When to call the healthcare provider  Call your healthcare provider right away if you have:   · Symptoms of impacted earwax  · Severe symptoms after earwax removal, such as bleeding or severe ear pain    [employer] last reviewed this educational content on 12/07/2017  © 2000-2020 [employer] [address], [place] [address]. All rights reserved. This information is not intended as a substitute for professional medical care. Always follow your healthcare professional's instructions.

## 2019-08-20 ENCOUNTER — Other Ambulatory Visit (INDEPENDENT_AMBULATORY_CARE_PROVIDER_SITE_OTHER): Payer: Self-pay | Admitting: Family Medicine

## 2019-08-20 DIAGNOSIS — E782 Mixed hyperlipidemia: Secondary | ICD-10-CM

## 2019-09-01 ENCOUNTER — Other Ambulatory Visit: Payer: No Typology Code available for payment source

## 2019-10-25 ENCOUNTER — Encounter (INDEPENDENT_AMBULATORY_CARE_PROVIDER_SITE_OTHER): Payer: Self-pay

## 2019-10-26 ENCOUNTER — Ambulatory Visit (INDEPENDENT_AMBULATORY_CARE_PROVIDER_SITE_OTHER): Payer: No Typology Code available for payment source | Admitting: Surgery

## 2019-10-26 ENCOUNTER — Encounter: Payer: Self-pay | Admitting: Surgery

## 2019-10-26 DIAGNOSIS — K429 Umbilical hernia without obstruction or gangrene: Secondary | ICD-10-CM | POA: Insufficient documentation

## 2019-10-26 NOTE — Progress Notes (Signed)
Visit Date Time: 10/26/2019  9:40 AM     Referring Provider:   Chrisandra Carota, MD    [facility] Provider: Talbert Nan, MD    Service: General Surgery    Reason For Visit:  Ventral Hernia          History of Present Illness:   Jerry Hickman is a 64 y.o. male who  has a past medical history of Abnormal vision, Arthritis, Avascular necrosis, Chronic hip pain, Depression screening negative (12-01-12), Difficulty in walking(719.7), GERD (gastroesophageal reflux disease), H/O splenomegaly, Headache following lumbar puncture (2004), Hyperlipidemia, Hypertensive disorder, and Sarcoid.Marland Kitchen  He presents with a possible ventral hernia.  The patient reports pain and bulging. The issue is located in the umbilical area area.  Onset was several years ago. Symptoms have been gradually worsening since. Aggravating factors: movement.  Alleviating factors: none. Associated symptoms: none. The patient denies nausea and vomiting.      Workup has included: none with findings of: .Marland Kitchen      Prior history of abdominal surgery includes : none.  Previous hernia repair(s) include: none.  Mesh is not currently present.    The following data was personally reviewed during the evaluation of this problem:  previous medical records/operative notes    Past Medical History:     Past Medical History:   Diagnosis Date    Abnormal vision     reading    Arthritis     L hip    Avascular necrosis     Chronic hip pain     Depression screening negative 12-01-12    Neg    Difficulty in walking(719.7)     limp .Marland KitchenMarland Kitchenpending surgery    GERD (gastroesophageal reflux disease)     on med    H/O splenomegaly     Denies this condition    Headache following lumbar puncture 2004    s/p spinal tap .... blood patch needed    Hyperlipidemia     on med    Hypertensive disorder     on med    Sarcoid     treated with steroids       Past Surgical History:     Past Surgical History:   Procedure Laterality Date    ARTHROPLASTY, HIP, TOTAL, ANTERIOR APPROACH, C ARM   03/10/2013    Procedure: ARTHROPLASTY, HIP, TOTAL, ANTERIOR APPROACH, C ARM;  Surgeon: Cynda Familia, MD;  Location: [facility];  Service: Orthopedics;  Laterality: Left;  LEFT HIP THA ANT      COLONOSCOPY  2014    JOINT REPLACEMENT      hip and knee    REPLACEMENT TOTAL KNEE  01-18-11    right    TOTAL HIP ARTHROPLASTY  11-2004    Right       Family History:     Family History   Problem Relation Age of Onset    Cancer Mother         ovarian    Heart attack Father     Heart disease Father     Hyperlipidemia Father     Hypertension Father     Cancer Sister         breast cancer       Social History:     Social History     Socioeconomic History    Marital status: Married     Spouse name: Not on file    Number of children: Not on file  Years of education: Not on file    Highest education level: Not on file   Occupational History    Not on file   Tobacco Use    Smoking status: Never Smoker    Smokeless tobacco: Never Used   Vaping Use    Vaping Use: Never used   Substance and Sexual Activity    Alcohol use: Yes     Alcohol/week: 3.3 standard drinks     Types: 3 Glasses of wine per week    Drug use: No    Sexual activity: Not on file   Other Topics Concern    Not on file   Social History Narrative    The patient is to Aggie Cosier and has been since 1990, with 2 sons and one girl.  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     Social Determinants of Health     Financial Resource Strain:     Difficulty of Paying Living Expenses:    Food Insecurity:     Worried About Programme researcher, broadcasting/film/video in the Last Year:     Barista in the Last Year:    Transportation Needs:     Freight forwarder (Medical):     Lack of Transportation (Non-Medical):    Physical Activity:     Days of Exercise per Week:     Minutes of Exercise per Session:    Stress:     Feeling of Stress :    Social Connections:     Frequency of Communication with Friends and Family:     Frequency  of Social Gatherings with Friends and Family:     Attends Religious Services:     Active Member of Clubs or Organizations:     Attends Banker Meetings:     Marital Status:    Intimate Partner Violence:     Fear of Current or Ex-Partner:     Emotionally Abused:     Physically Abused:     Sexually Abused:        Allergies:   Patient has no known allergies.    Medications:     Current Outpatient Medications   Medication Sig Dispense Refill    atorvastatin (LIPITOR) 20 MG tablet TAKE 1 TABLET BY MOUTH EVERY DAY 90 tablet 1    pantoprazole (PROTONIX) 20 MG tablet TAKE 1 TABLET BY MOUTH EVERY DAY 90 tablet 0    valsartan (DIOVAN) 80 MG tablet TAKE 1 TABLET BY MOUTH EVERY DAY 90 tablet 0     No current facility-administered medications for this visit.       Review of Systems:   ROS   As per the HPI and above. The patient otherwise denies any additional changes to their otic, opthalmologic, dermatologic, pulmonary, cardiac, gastrointestinal, genitourinary, musculoskeletal, hematologic, constitutional, or psychiatric systems.      Physical Exam:     Vitals:    10/26/19 0924   BP: 171/86   Pulse: 63   Temp: 98 F (36.7 C)   SpO2: 95%     Constitutional: Appears well, stated age.  Well nourished and well developed.  Eyes:  Conjunctiva and lids normal with no jaundice, PERRL.  Vision grossly intact.  Ear, Nose, Mouth and Throat:  Normal appearing external ears, nose and mouth.  Hearing grossly normal bilaterally.  Normal lips, teeth and gums.  Neck: Symmetric with no masses, thyromegaly, JVD, adenopathy, bruit, tenderness.  Good range of motion.  Midline trachea.  Respiratory:  Normal respiration with no effort.  No audible wheezing.  Normal and symmetric breath sounds.  No abnormalities to tenderness or percussion.  CV: Normal heart sounds with no murmur.  Normal carotid and pedal pulses.  No peripheral edema.  Abdomen: obese abdomen, nontender and nondistended, with no masses or organomegaly.  Hernia  exam reveals: umbilical hernia  Lymphatic: No adenopathy of neck, axillae or groins.  Skin: Normal color and turgor with no rash, lesions or ulceration.  No induration or nodules on palpation.  Neuro: No gross deficits in cranial nerve function, sensation or motor function.  Psych: Normal judgement, insight, memory, mood, affect.  Oriented X 3.    Labs:   No results found for: CBC, BMP   Lab Results   Component Value Date    ALT 131 (H) 08/13/2019    AST 87 (H) 08/13/2019    ALKPHOS 64 08/13/2019    BILITOTAL 0.5 08/13/2019      Rads:     Radiology Results (24 Hour)     ** No results found for the last 24 hours. **        No results found.   Impression:     Patient Active Problem List   Diagnosis    History of sarcoidosis    Essential hypertension, benign    Mixed hyperlipidemia    ED (erectile dysfunction)    GERD (gastroesophageal reflux disease)    Localized osteoarthrosis not specified whether primary or secondary, pelvic region and thigh    Umbilical hernia without obstruction or gangrene     1. Umbilical hernia without obstruction or gangrene     Plan:     OPEN HERNIA REPAIR: At this time I am recommending open repair of the above described hernia. The risks of the surgery were outlined today and include bleeding, infection, bowel injury, recurrence of hernia as well as other unforeseen events. The patient understood and all their questions were answered. Patient would like to proceed with surgery.        Given the patient's diagnosis of obesity, we discussed associated the increased risk of post-operative complications to include but not be limited to poorly healing wounds, poor immune response, hyperglycemia, and other metabolic derangements as well as post-operative pneumonia and the increased risk of venous thrombosis.    Risk/benefit analysis commensurate with the ACS Risk Calculator was used to counsel the patient regarding surgery.  Thank you for sending this patient to [facility] for evaluation.  We  appreciate the opportunity to participate in their care.  We will address the issue for which they were sent, and afterward return the patient to your care and/or their PCP for any ongoing issues.  If you have any questions, do not hesitate to contact us at [phone].    Signed by: Talbert Nan, MD

## 2019-10-27 ENCOUNTER — Encounter: Payer: Self-pay | Admitting: Surgery

## 2019-10-27 NOTE — Progress Notes (Signed)
Umbilical hernia repair    ICD K42.9  CPT 49585    [facility] 07/29 [facility] w/[facility] @ 9:00am    NPR-Per [employer] Ref # [id number]

## 2019-11-03 ENCOUNTER — Other Ambulatory Visit: Payer: Self-pay | Admitting: Surgery

## 2019-11-03 DIAGNOSIS — K420 Umbilical hernia with obstruction, without gangrene: Secondary | ICD-10-CM

## 2019-11-03 MED ORDER — OXYCODONE HCL 5 MG PO TABS
5.0000 mg | ORAL_TABLET | ORAL | 0 refills | Status: DC | PRN
Start: 2019-11-03 — End: 2019-11-11

## 2019-11-04 ENCOUNTER — Encounter: Payer: Self-pay | Admitting: Surgery

## 2019-11-09 ENCOUNTER — Other Ambulatory Visit (INDEPENDENT_AMBULATORY_CARE_PROVIDER_SITE_OTHER): Payer: Self-pay | Admitting: Family Medicine

## 2019-11-09 DIAGNOSIS — I10 Essential (primary) hypertension: Secondary | ICD-10-CM

## 2019-11-09 DIAGNOSIS — K219 Gastro-esophageal reflux disease without esophagitis: Secondary | ICD-10-CM

## 2019-11-11 ENCOUNTER — Encounter: Payer: Self-pay | Admitting: Surgery

## 2019-11-11 ENCOUNTER — Ambulatory Visit (INDEPENDENT_AMBULATORY_CARE_PROVIDER_SITE_OTHER): Payer: No Typology Code available for payment source | Admitting: Surgery

## 2019-11-11 VITALS — BP 149/88 | HR 74 | Temp 98.6°F | Ht 69.0 in | Wt 243.6 lb

## 2019-11-11 DIAGNOSIS — Z9889 Other specified postprocedural states: Secondary | ICD-10-CM

## 2019-11-11 NOTE — Progress Notes (Signed)
Visit Date Time: 11/11/2019  9:48 AM     Referring Provider:   Chrisandra Carota, MD    [facility] Provider: Talbert Nan, MD    POSTOP NOTE:    Reason for visit:  Here for a postop visit    Jerry Hickman is a 64 y.o. male who  has a past medical history of Abnormal vision, Arthritis, Avascular necrosis, Chronic hip pain, Depression screening negative (12-01-12), Difficulty in walking(719.7), GERD (gastroesophageal reflux disease), H/O splenomegaly, Headache following lumbar puncture (2004), Hyperlipidemia, Hypertensive disorder, and Sarcoid.Marland Kitchen  He  presents after recent surgery.  He had ventral hernia repair by Dr. Eustace Quail  at [facility].     Pathology: Not sent    He reports uneventful postop recovery and denies fever, chills, nausea, vomiting, diarrhea, constipation, incisional redness, incisional drainage and increasing pain.    The patient has not been to the ED since surgery/last visit.  The patient did not require readmission since surgery/last visit.    Additional issues: None.    Past Medical History:     Past Medical History:   Diagnosis Date    Abnormal vision     reading    Arthritis     L hip    Avascular necrosis     Chronic hip pain     Depression screening negative 12-01-12    Neg    Difficulty in walking(719.7)     limp .Marland KitchenMarland Kitchenpending surgery    GERD (gastroesophageal reflux disease)     on med    H/O splenomegaly     Denies this condition    Headache following lumbar puncture 2004    s/p spinal tap .... blood patch needed    Hyperlipidemia     on med    Hypertensive disorder     on med    Sarcoid     treated with steroids       Past Surgical History:     Past Surgical History:   Procedure Laterality Date    ARTHROPLASTY, HIP, TOTAL, ANTERIOR APPROACH, C ARM  03/10/2013    Procedure: ARTHROPLASTY, HIP, TOTAL, ANTERIOR APPROACH, C ARM;  Surgeon: Cynda Familia, MD;  Location: [facility];  Service: Orthopedics;  Laterality: Left;  LEFT HIP THA ANT      COLONOSCOPY  2014    JOINT  REPLACEMENT      hip and knee    REPLACEMENT TOTAL KNEE  01-18-11    right    TOTAL HIP ARTHROPLASTY  11-2004    Right       Family History:     Family History   Problem Relation Age of Onset    Cancer Mother         ovarian    Heart attack Father     Heart disease Father     Hyperlipidemia Father     Hypertension Father     Cancer Sister         breast cancer       Social History:     Social History     Socioeconomic History    Marital status: Married     Spouse name: Not on file    Number of children: Not on file    Years of education: Not on file    Highest education level: Not on file   Occupational History    Not on file   Tobacco Use    Smoking status: Never Smoker    Smokeless tobacco:  Never Used   Vaping Use    Vaping Use: Never used   Substance and Sexual Activity    Alcohol use: Yes     Alcohol/week: 3.3 standard drinks     Types: 3 Glasses of wine per week    Drug use: No    Sexual activity: Not on file   Other Topics Concern    Not on file   Social History Narrative    The patient is to Aggie Cosier and has been since 1990, with 2 sons and one girl.  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     Social Determinants of Health     Financial Resource Strain:     Difficulty of Paying Living Expenses:    Food Insecurity:     Worried About Programme researcher, broadcasting/film/video in the Last Year:     Barista in the Last Year:    Transportation Needs:     Freight forwarder (Medical):     Lack of Transportation (Non-Medical):    Physical Activity:     Days of Exercise per Week:     Minutes of Exercise per Session:    Stress:     Feeling of Stress :    Social Connections:     Frequency of Communication with Friends and Family:     Frequency of Social Gatherings with Friends and Family:     Attends Religious Services:     Active Member of Clubs or Organizations:     Attends Banker Meetings:     Marital Status:    Intimate Partner Violence:     Fear  of Current or Ex-Partner:     Emotionally Abused:     Physically Abused:     Sexually Abused:        Allergies:   Patient has no known allergies.    Medications:     Current Outpatient Medications   Medication Sig Dispense Refill    atorvastatin (LIPITOR) 20 MG tablet TAKE 1 TABLET BY MOUTH EVERY DAY 90 tablet 1    pantoprazole (PROTONIX) 20 MG tablet TAKE 1 TABLET BY MOUTH EVERY DAY 90 tablet 0    valsartan (DIOVAN) 80 MG tablet TAKE 1 TABLET BY MOUTH EVERY DAY 90 tablet 0     No current facility-administered medications for this visit.       Review of Systems:     ROS   As per the HPI and above. The patient otherwise denies any additional changes to their otic, opthalmologic, dermatologic, pulmonary, cardiac, gastrointestinal, genitourinary, musculoskeletal, hematologic, constitutional, or psychiatric systems.      Physical Exam:     Vitals:    11/11/19 0918   BP: 149/88   Pulse: 74   Temp: 98.6 F (37 C)   SpO2: 94%       Appears comfortable  No labored breathing  The wound(s) demonstrate good healing and appearance.    Assessment:     Patient Active Problem List   Diagnosis    History of sarcoidosis    Essential hypertension, benign    Mixed hyperlipidemia    ED (erectile dysfunction)    GERD (gastroesophageal reflux disease)    Localized osteoarthrosis not specified whether primary or secondary, pelvic region and thigh    Umbilical hernia without obstruction or gangrene     1. Post-operative state       Plan:     Postop instructions: wound care instructions given (  no further care)    Follow up: as needed    Thank you for sending this patient to [facility] for surgical treatment.  We appreciate the opportunity to participate in their care.  Their surgical issue has been addressed, and they will be returned to your care and/or their PCP for any ongoing issues.  If you have any questions, do not hesitate to contact us at [phone].    Signed by: Talbert Nan, MD

## 2019-11-30 ENCOUNTER — Encounter: Payer: Self-pay | Admitting: Surgery

## 2019-12-10 ENCOUNTER — Other Ambulatory Visit (INDEPENDENT_AMBULATORY_CARE_PROVIDER_SITE_OTHER): Payer: Self-pay | Admitting: Family Medicine

## 2020-01-20 ENCOUNTER — Encounter: Payer: Self-pay | Admitting: Surgery

## 2020-01-31 ENCOUNTER — Other Ambulatory Visit (INDEPENDENT_AMBULATORY_CARE_PROVIDER_SITE_OTHER): Payer: Self-pay | Admitting: Family Medicine

## 2020-01-31 DIAGNOSIS — K219 Gastro-esophageal reflux disease without esophagitis: Secondary | ICD-10-CM

## 2020-01-31 DIAGNOSIS — I10 Essential (primary) hypertension: Secondary | ICD-10-CM

## 2020-01-31 DIAGNOSIS — E782 Mixed hyperlipidemia: Secondary | ICD-10-CM

## 2020-03-16 ENCOUNTER — Encounter (INDEPENDENT_AMBULATORY_CARE_PROVIDER_SITE_OTHER): Payer: Self-pay

## 2020-04-15 ENCOUNTER — Encounter (INDEPENDENT_AMBULATORY_CARE_PROVIDER_SITE_OTHER): Payer: Self-pay

## 2020-04-16 ENCOUNTER — Encounter (INDEPENDENT_AMBULATORY_CARE_PROVIDER_SITE_OTHER): Payer: Self-pay

## 2020-04-19 ENCOUNTER — Telehealth: Payer: Self-pay

## 2020-04-19 NOTE — Telephone Encounter (Signed)
The [employer] coordinator made an outreach call to patient to see if writer could be of any assistance in helping them manage their healthcare needs. Left voicemail with contact information to return call.       Faryal Marxen G.  INC Coordinator   [employer]

## 2020-05-01 ENCOUNTER — Telehealth: Payer: Self-pay

## 2020-05-01 NOTE — Telephone Encounter (Signed)
The [employer] coordinator made an outreach call to patient to see if writer could be of any assistance in helping them manage their healthcare needs. Left voicemail with contact information to return call.       Nechuma Boven G.  INC Coordinator   [employer]

## 2020-05-08 ENCOUNTER — Other Ambulatory Visit (INDEPENDENT_AMBULATORY_CARE_PROVIDER_SITE_OTHER): Payer: Self-pay | Admitting: Family Medicine

## 2020-05-08 DIAGNOSIS — K219 Gastro-esophageal reflux disease without esophagitis: Secondary | ICD-10-CM

## 2020-05-08 DIAGNOSIS — I10 Essential (primary) hypertension: Secondary | ICD-10-CM

## 2020-05-09 ENCOUNTER — Telehealth: Payer: Self-pay

## 2020-05-09 NOTE — Telephone Encounter (Signed)
The [employer] coordinator made an outreach call to patient to see if writer could be of any assistance in helping them manage their healthcare needs. Left voicemail with contact information to return call.       Anai Lipson G.  INC Coordinator   [employer]

## 2020-05-16 ENCOUNTER — Encounter (INDEPENDENT_AMBULATORY_CARE_PROVIDER_SITE_OTHER): Payer: Self-pay

## 2020-05-17 ENCOUNTER — Encounter (INDEPENDENT_AMBULATORY_CARE_PROVIDER_SITE_OTHER): Payer: Self-pay

## 2020-06-14 ENCOUNTER — Encounter (INDEPENDENT_AMBULATORY_CARE_PROVIDER_SITE_OTHER): Payer: Self-pay

## 2020-07-14 ENCOUNTER — Encounter (INDEPENDENT_AMBULATORY_CARE_PROVIDER_SITE_OTHER): Payer: Self-pay

## 2020-07-15 ENCOUNTER — Encounter (INDEPENDENT_AMBULATORY_CARE_PROVIDER_SITE_OTHER): Payer: Self-pay

## 2020-07-17 ENCOUNTER — Telehealth (INDEPENDENT_AMBULATORY_CARE_PROVIDER_SITE_OTHER): Payer: Self-pay | Admitting: Family Medicine

## 2020-07-17 ENCOUNTER — Other Ambulatory Visit (INDEPENDENT_AMBULATORY_CARE_PROVIDER_SITE_OTHER): Payer: Self-pay | Admitting: Family Medicine

## 2020-07-17 MED ORDER — SILDENAFIL CITRATE 50 MG PO TABS
50.0000 mg | ORAL_TABLET | ORAL | 1 refills | Status: DC | PRN
Start: 2020-07-17 — End: 2020-08-10

## 2020-07-17 NOTE — Telephone Encounter (Signed)
I don't see it, in his chart. Please advise

## 2020-07-17 NOTE — Telephone Encounter (Signed)
Left vm for pt

## 2020-07-17 NOTE — Telephone Encounter (Signed)
Let him know I have sent in his prescription but he is due to be seen.

## 2020-07-17 NOTE — Telephone Encounter (Signed)
Patient called requesting medication refill for sildenafil (VIAGRA) 50 MG tablet   To be sent to:    [id number] - [place], [place] - [address] AT [facility]  Phone: [phone]  Fax:  [phone]

## 2020-08-01 ENCOUNTER — Other Ambulatory Visit (INDEPENDENT_AMBULATORY_CARE_PROVIDER_SITE_OTHER): Payer: Self-pay | Admitting: Family Medicine

## 2020-08-01 DIAGNOSIS — K219 Gastro-esophageal reflux disease without esophagitis: Secondary | ICD-10-CM

## 2020-08-01 DIAGNOSIS — I10 Essential (primary) hypertension: Secondary | ICD-10-CM

## 2020-08-01 NOTE — Telephone Encounter (Signed)
Let him know he is due to be seen

## 2020-08-01 NOTE — Telephone Encounter (Signed)
Pt is scheduled for an appt on 08/04/20.

## 2020-08-04 ENCOUNTER — Ambulatory Visit (INDEPENDENT_AMBULATORY_CARE_PROVIDER_SITE_OTHER): Payer: No Typology Code available for payment source | Admitting: Family Medicine

## 2020-08-04 ENCOUNTER — Encounter (INDEPENDENT_AMBULATORY_CARE_PROVIDER_SITE_OTHER): Payer: Self-pay | Admitting: Family Medicine

## 2020-08-04 VITALS — BP 132/80 | HR 91 | Temp 97.5°F | Ht 69.0 in | Wt 249.0 lb

## 2020-08-04 DIAGNOSIS — K219 Gastro-esophageal reflux disease without esophagitis: Secondary | ICD-10-CM

## 2020-08-04 DIAGNOSIS — Z23 Encounter for immunization: Secondary | ICD-10-CM

## 2020-08-04 DIAGNOSIS — N5089 Other specified disorders of the male genital organs: Secondary | ICD-10-CM

## 2020-08-04 DIAGNOSIS — N452 Orchitis: Secondary | ICD-10-CM

## 2020-08-04 DIAGNOSIS — E782 Mixed hyperlipidemia: Secondary | ICD-10-CM

## 2020-08-04 DIAGNOSIS — I10 Essential (primary) hypertension: Secondary | ICD-10-CM

## 2020-08-04 LAB — POCT URINALYSIS DIPSTIX (10)(MULTI-TEST)
Bilirubin, UA POCT: NEGATIVE
Blood, UA POCT: NEGATIVE
Glucose, UA POCT: NEGATIVE mg/dL
Ketones, UA POCT: NEGATIVE mg/dL
Nitrite, UA POCT: NEGATIVE
POCT Leukocytes, UA: NEGATIVE
POCT Spec Gravity, UA: 1.025 (ref 1.001–1.035)
POCT pH, UA: 6 (ref 5–8)
Protein, UA POCT: NEGATIVE mg/dL
Urobilinogen, UA: 0.2 mg/dL

## 2020-08-04 MED ORDER — DOXYCYCLINE HYCLATE 100 MG PO CAPS
100.0000 mg | ORAL_CAPSULE | Freq: Two times a day (BID) | ORAL | 0 refills | Status: AC
Start: 2020-08-04 — End: 2020-08-14

## 2020-08-04 MED ORDER — VALSARTAN 80 MG PO TABS
80.0000 mg | ORAL_TABLET | Freq: Every day | ORAL | 3 refills | Status: DC
Start: 2020-08-04 — End: 2021-07-17

## 2020-08-04 MED ORDER — PANTOPRAZOLE SODIUM 20 MG PO TBEC
20.00 mg | DELAYED_RELEASE_TABLET | Freq: Every day | ORAL | 3 refills | Status: DC
Start: 2020-08-04 — End: 2020-10-16

## 2020-08-04 MED ORDER — ATORVASTATIN CALCIUM 20 MG PO TABS
20.0000 mg | ORAL_TABLET | Freq: Every day | ORAL | 3 refills | Status: DC
Start: 2020-08-04 — End: 2021-07-02

## 2020-08-04 NOTE — Progress Notes (Signed)
Have you seen any specialists/other providers since your last visit with us?    No    Arm preference verified?   Yes    The patient is due for MD WILL ASSESS

## 2020-08-04 NOTE — Progress Notes (Signed)
Subjective:      Patient ID: Jerry Hickman is a 65 y.o. male.    Chief Complaint:  Chief Complaint   Patient presents with   . Testicle Pain   . Medication Refill       HPI:  HPI     The patient is here today for follow-up of his hypertension. It has been good at home.    It is running in the 130s/80s.   He is exercising with an hour of cardio daily and doing weights.   His diet is pretty good.    He is here today for follow-up of his hyperlipidemia.  He is having no problems with his medication.    Is here today for follow-up of his reflux.  He is taking the medication every other day.   It is with certain foods.  He is having no changes in his bowel habits.    He is here for follow up of his elevated liver enzymes.  He has cut back on his alcohol and finds that advil seems to make his enzymes go up.    He is here due to some testicular pain.  This has been going on for two weeks.   He has had no urinary problems.   He has had no discharge.   This seems to have strarted after being sexually active again after a while.    He would also like to get a covid booster.   He has just had the J and J vaccine.    Problem List:  Patient Active Problem List   Diagnosis   . History of sarcoidosis   . Essential hypertension, benign   . Mixed hyperlipidemia   . ED (erectile dysfunction)   . GERD (gastroesophageal reflux disease)   . Localized osteoarthrosis not specified whether primary or secondary, pelvic region and thigh   . Umbilical hernia without obstruction or gangrene       Current Medications:  Current Outpatient Medications   Medication Sig Dispense Refill   . atorvastatin (LIPITOR) 20 MG tablet TAKE 1 TABLET BY MOUTH EVERY DAY 90 tablet 1   . pantoprazole (PROTONIX) 20 MG tablet TAKE 1 TABLET BY MOUTH EVERY DAY 90 tablet 0   . sildenafil (Viagra) 50 MG tablet Take 1 tablet (50 mg total) by mouth as needed for Erectile Dysfunction 10 tablet 1   . valsartan (DIOVAN) 80 MG tablet TAKE 1 TABLET BY MOUTH EVERY DAY 90  tablet 0     No current facility-administered medications for this visit.       Allergies:  No Known Allergies    Past Medical History:  Past Medical History:   Diagnosis Date   . Abnormal vision     reading   . Arthritis     L hip   . Avascular necrosis    . Chronic hip pain    . Depression screening negative 12-01-12    Neg   . Difficulty in walking(719.7)     limp .Marland KitchenMarland Kitchenpending surgery   . GERD (gastroesophageal reflux disease)     on med   . H/O splenomegaly     Denies this condition   . Headache following lumbar puncture 2004    s/p spinal tap .... blood patch needed   . Hyperlipidemia     on med   . Hypertensive disorder     on med   . Sarcoid     treated with steroids  Past Surgical History:  Past Surgical History:   Procedure Laterality Date   . ARTHROPLASTY, HIP, TOTAL, ANTERIOR APPROACH, C ARM  03/10/2013    Procedure: ARTHROPLASTY, HIP, TOTAL, ANTERIOR APPROACH, C ARM;  Surgeon: Cynda Familia, MD;  Location: [facility];  Service: Orthopedics;  Laterality: Left;  LEFT HIP THA ANT     . COLONOSCOPY  2014   . JOINT REPLACEMENT      hip and knee   . REPLACEMENT TOTAL KNEE  01-18-11    right   . TOTAL HIP ARTHROPLASTY  11-2004    Right       Family History:  Family History   Problem Relation Age of Onset   . Cancer Mother         ovarian   . Heart attack Father    . Heart disease Father    . Hyperlipidemia Father    . Hypertension Father    . Cancer Sister         breast cancer       Social History:  Social History     Socioeconomic History   . Marital status: Divorced   Tobacco Use   . Smoking status: Never Smoker   . Smokeless tobacco: Never Used   Vaping Use   . Vaping Use: Never used   Substance and Sexual Activity   . Alcohol use: Yes     Alcohol/week: 3.3 standard drinks     Types: 3 Glasses of wine per week   . Drug use: No   Social History Narrative    The patient is to Jerry Hickman and has been since 1990, with 2 sons and one girl.  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        The following sections were reviewed this encounter by the provider:   Tobacco  Allergies  Meds  Problems  Med Hx  Surg Hx  Fam Hx           ROS:  Review of Systems   Constitutional: Negative for fatigue.   HENT: Negative for nosebleeds and tinnitus.    Eyes: Negative for visual disturbance.   Respiratory: Negative for chest tightness and shortness of breath.    Cardiovascular: Negative for chest pain and palpitations.   Neurological: Negative for dizziness, light-headedness and headaches.   Psychiatric/Behavioral: Negative for sleep disturbance. The patient is not nervous/anxious.        Vitals:  BP 132/80 (BP Site: Left arm, Patient Position: Sitting, Cuff Size: Medium)   Pulse 91   Temp 97.5 F (36.4 C) (Temporal)   Ht 1.753 m (5\' 9" )   Wt 112.9 kg (249 lb)   SpO2 99%   BMI 36.77 kg/m      Objective:     Physical Exam:  Physical Exam  Constitutional:       General: He is not in acute distress.     Appearance: He is well-developed. He is not diaphoretic.   HENT:      Head: Normocephalic and atraumatic.      Right Ear: Tympanic membrane, ear canal and external ear normal.      Left Ear: Tympanic membrane, ear canal and external ear normal.      Nose: Nose normal.      Mouth/Throat:      Mouth: Mucous membranes are moist.      Pharynx: No oropharyngeal exudate.   Eyes:      General: No scleral  icterus.        Right eye: No discharge.         Left eye: No discharge.      Extraocular Movements: Extraocular movements intact.      Conjunctiva/sclera: Conjunctivae normal.      Pupils: Pupils are equal, round, and reactive to light.   Neck:      Thyroid: No thyromegaly.      Vascular: No JVD.   Cardiovascular:      Rate and Rhythm: Normal rate and regular rhythm.      Heart sounds: Normal heart sounds. No murmur heard.    No friction rub. No gallop.   Pulmonary:      Effort: Pulmonary effort is normal. No respiratory distress.      Breath sounds: Normal breath sounds. No wheezing or rales.    Abdominal:      General: Bowel sounds are normal. There is no distension.      Palpations: Abdomen is soft. There is no mass.      Tenderness: There is no abdominal tenderness.   Genitourinary:     Comments: He has an enlarged, mildly tender left testicle without mass.    Musculoskeletal:         General: No tenderness. Normal range of motion.      Cervical back: Neck supple.      Right lower leg: No edema.      Left lower leg: No edema.   Lymphadenopathy:      Cervical: No cervical adenopathy.   Skin:     General: Skin is warm and dry.      Findings: No rash.   Neurological:      Mental Status: He is alert and oriented to person, place, and time.      Cranial Nerves: No cranial nerve deficit.   Psychiatric:         Mood and Affect: Mood normal.         Thought Content: Thought content normal.         Judgment: Judgment normal.     Blood pressure 132/80, pulse 91, temperature 97.5 F (36.4 C), temperature source Temporal, height 1.753 m (5\' 9" ), weight 112.9 kg (249 lb), SpO2 99 %.         Assessment:     1. Essential hypertension.      His blood pressure is pretty much at goal.  He is taking his medication regularly without difficulties.  He is exercising regularly.  He is watching his diet and limiting his salt.  I will check some labs and have him continue the same for now.  I would still like him to work on decreasing the weight.    2. Gastroesophageal reflux disease.    He still gets some occasional reflux but this really depends on what he is eating.  He is having no trouble with the medication.  He is only taking it every other day.  I will have him continue the same for now.    3. Mixed hyperlipidemia.    He is taking his statin without difficulties.  He is watching his diet.  He is having no muscle aches or pains.  Again I like him to keep working on diet and weight loss as well as exercise.  I will check some labs    4.  History of elevated liver enzymes.  He is overdue for follow-up on his labs.  He  has cut back on his alcohol.  We  will check his liver enzymes    5.  Enlarged left testicle.  I suspect this is probably an orchitis.  He only has mild tenderness.  Because of the enlargement I will have him get an ultrasound as well but I will put him on some doxycycline.    6.  Health maintenance.  He has not yet had his COVID booster.  We will do that today.  He did have his colonoscopy last year and will have a copy of that sent to me.      Plan:     1.  CBC, CMP, lipids  2.  Urinalysis  3.  Scrotal ultrasound  4.  Doxycycline 100 mg twice daily for 10 days.  5.  Copy of his colonoscopy  6.  COVID booster  7.  Continue his atorvastatin 20 mg for his hyperlipidemia  8.  Continue his valsartan 80 mg daily for his hypertension  9.  Continue his pantoprazole 20 mg every other day for his reflux  11.  Keep working on diet and exercise to optimize his BMI  12.  Follow-up with me in 3 months for full physical, sooner depending on lab results    Chrisandra Carota, MD

## 2020-08-04 NOTE — Addendum Note (Signed)
Addended bySerita Butcher on: 08/04/2020 05:10 PM     Modules accepted: Orders

## 2020-08-04 NOTE — Progress Notes (Addendum)
COVID-19 Vaccination:  Jerry Hickman   [birth year]  08/04/2020     Patient presented to the office for the following COVID-19 vaccine administration:      [x]  Pfizer-BioNTech  []  Moderna     []  Dose #1   []  Dose #2      (Both doses of the series should be completed)     []  Additional 3rd Dose (Pt must have completed initial Pfizer/Moderna series at least 28 days ago. Authorized for persons with solid organ transplants, or diagnosed with conditions considered to have an equivalent level of moderate/severe immunocompromise)    [x]  Booster (18+ yrs - Pt must have completed Pfizer/Moderna series at least 6 months ago or completed J+J vaccination at least 2 months ago)    []  Booster #2   o Is 65 years of age and received the J&J single dose vaccine at least 2 months ago  o Completed first booster at least 4 months ago and falls within the following categories:  o >20 years old  o >14 years old who received initial J&J dose and J&J booster   o >13 years old with a weak immune system       []  Jansen/J+J        []  Dose #1       []  Booster (18+ yrs - Pt must have completed J+J vaccination at least 2 months ago or completed Pfizer/Moderna series at least 6 months ago)          [x]  A verbal consent has been obtained from patient to administer COVID-19 vaccine that is currently being offered under Emergency Use Authorization (EUA). Patient/Caregiver has been provided a copy of EUA.      []  Pt was screened for precautions and contraindications to vaccine  []  24-28+ day interval observed between first and second dose (Moderna)  []  17-21+ day interval observed between first and second dose (Pfizer-BioNTech)  []  28+ day interval observed between additional third dose and initial (Pfizer-BioNTech / Moderna series)  []  > 6 months interval observed between booster and initial (Pfizer-BioNTech / Moderna series)  [x]  > 2 months interval observed between initial (Jansen/J+J single dose)  []  > 4 months interval observed between 2nd booster  and initial (Pfizer-BioNTech / Moderna booster)      COVID-19 Post-Vaccination Observation:  Jerry Hickman   Aug 10, 1955  08/04/2020     Pt was observed post-vaccination for the occurrence of immediate adverse reactions:   [x]  15 minutes: No prior history of significant allergic reaction to vaccine or injectable therapy or history of anaphylaxis due to any cause   []  30 minutes: Prior history of an immediate allergic reaction of any severity to a vaccine or injectable therapy (urticaria, angioedema, respiratory distress (e.g., wheezing, stridor))  []  30 minutes: Prior history of anaphylaxis due to any cause     Post-Vaccination Observation:  [x]  No reaction was noted and patient left in good condition.  []  Arm Soreness  []  Erythema  []  Hives  []  Other:    Patient presented to the office for Pfizer booster administration.  Received injection in the Right arm.  No reaction was noted and patient left in good condition.

## 2020-08-07 ENCOUNTER — Other Ambulatory Visit (INDEPENDENT_AMBULATORY_CARE_PROVIDER_SITE_OTHER): Payer: Self-pay | Admitting: Family Medicine

## 2020-08-07 ENCOUNTER — Other Ambulatory Visit (FREE_STANDING_LABORATORY_FACILITY): Payer: No Typology Code available for payment source

## 2020-08-07 DIAGNOSIS — E782 Mixed hyperlipidemia: Secondary | ICD-10-CM

## 2020-08-07 DIAGNOSIS — R739 Hyperglycemia, unspecified: Secondary | ICD-10-CM

## 2020-08-07 DIAGNOSIS — I10 Essential (primary) hypertension: Secondary | ICD-10-CM

## 2020-08-07 LAB — COMPREHENSIVE METABOLIC PANEL
ALT: 128 U/L — ABNORMAL HIGH (ref 0–55)
AST (SGOT): 61 U/L — ABNORMAL HIGH (ref 5–34)
Albumin/Globulin Ratio: 1.3 (ref 0.9–2.2)
Albumin: 3.9 g/dL (ref 3.5–5.0)
Alkaline Phosphatase: 62 U/L (ref 37–117)
Anion Gap: 9 (ref 5.0–15.0)
BUN: 19 mg/dL (ref 9.0–28.0)
Bilirubin, Total: 0.6 mg/dL (ref 0.2–1.2)
CO2: 29 mEq/L (ref 21–29)
Calcium: 9.1 mg/dL (ref 8.5–10.5)
Chloride: 99 mEq/L — ABNORMAL LOW (ref 100–111)
Creatinine: 1.3 mg/dL (ref 0.5–1.5)
Globulin: 3 g/dL (ref 2.0–3.7)
Glucose: 125 mg/dL — ABNORMAL HIGH (ref 70–100)
Potassium: 4.2 mEq/L (ref 3.5–5.1)
Protein, Total: 6.9 g/dL (ref 6.0–8.3)
Sodium: 137 mEq/L (ref 136–145)

## 2020-08-07 LAB — CBC AND DIFFERENTIAL
Absolute NRBC: 0 10*3/uL (ref 0.00–0.00)
Basophils Absolute Automated: 0.03 10*3/uL (ref 0.00–0.08)
Basophils Automated: 0.8 %
Eosinophils Absolute Automated: 0.08 10*3/uL (ref 0.00–0.44)
Eosinophils Automated: 2 %
Hematocrit: 44.1 % (ref 37.6–49.6)
Hgb: 15 g/dL (ref 12.5–17.1)
Immature Granulocytes Absolute: 0.02 10*3/uL (ref 0.00–0.07)
Immature Granulocytes: 0.5 %
Lymphocytes Absolute Automated: 1.26 10*3/uL (ref 0.42–3.22)
Lymphocytes Automated: 31.6 %
MCH: 33 pg (ref 25.1–33.5)
MCHC: 34 g/dL (ref 31.5–35.8)
MCV: 97.1 fL — ABNORMAL HIGH (ref 78.0–96.0)
MPV: 11.2 fL (ref 8.9–12.5)
Monocytes Absolute Automated: 0.65 10*3/uL (ref 0.21–0.85)
Monocytes: 16.3 %
Neutrophils Absolute: 1.95 10*3/uL (ref 1.10–6.33)
Neutrophils: 48.8 %
Nucleated RBC: 0 /100 WBC (ref 0.0–0.0)
Platelets: 148 10*3/uL (ref 142–346)
RBC: 4.54 10*6/uL (ref 4.20–5.90)
RDW: 13 % (ref 11–15)
WBC: 3.99 10*3/uL (ref 3.10–9.50)

## 2020-08-07 LAB — LIPID PANEL
Cholesterol / HDL Ratio: 2.9
Cholesterol: 211 mg/dL — ABNORMAL HIGH (ref 0–199)
HDL: 73 mg/dL (ref 40–9999)
LDL Calculated: 106 mg/dL — ABNORMAL HIGH (ref 0–99)
Triglycerides: 160 mg/dL — ABNORMAL HIGH (ref 34–149)
VLDL Calculated: 32 mg/dL (ref 10–40)

## 2020-08-07 LAB — HEMOLYSIS INDEX: Hemolysis Index: 3 (ref 0–24)

## 2020-08-07 LAB — GFR: EGFR: 55.8

## 2020-08-07 NOTE — Progress Notes (Signed)
Pt viewed results via MyChart

## 2020-08-08 LAB — HEMOGLOBIN A1C
Average Estimated Glucose: 114 mg/dL
Hemoglobin A1C: 5.6 % (ref 4.6–5.9)

## 2020-08-09 ENCOUNTER — Other Ambulatory Visit (INDEPENDENT_AMBULATORY_CARE_PROVIDER_SITE_OTHER): Payer: Self-pay | Admitting: Family Medicine

## 2020-08-09 ENCOUNTER — Ambulatory Visit
Admission: RE | Admit: 2020-08-09 | Discharge: 2020-08-09 | Disposition: A | Discharge status: Home / Self Care | Payer: No Typology Code available for payment source | Source: Ambulatory Visit | Attending: Family Medicine | Admitting: Family Medicine

## 2020-08-09 ENCOUNTER — Ambulatory Visit: Payer: No Typology Code available for payment source

## 2020-08-09 DIAGNOSIS — N433 Hydrocele, unspecified: Secondary | ICD-10-CM | POA: Insufficient documentation

## 2020-08-09 DIAGNOSIS — N5089 Other specified disorders of the male genital organs: Secondary | ICD-10-CM | POA: Insufficient documentation

## 2020-08-09 DIAGNOSIS — N452 Orchitis: Secondary | ICD-10-CM | POA: Insufficient documentation

## 2020-08-09 NOTE — Telephone Encounter (Signed)
[facility] called with urgent findings from pt's scrotal ultrasound findings.   Moderate complex left hydrocele. Possible orchitis, no hyper vascularity involved. Small simple right hydrocele.     Please advise, thank you.  Will route to 2 providers.

## 2020-08-13 ENCOUNTER — Encounter (INDEPENDENT_AMBULATORY_CARE_PROVIDER_SITE_OTHER): Payer: Self-pay

## 2020-08-14 ENCOUNTER — Encounter (INDEPENDENT_AMBULATORY_CARE_PROVIDER_SITE_OTHER): Payer: Self-pay

## 2020-08-21 ENCOUNTER — Telehealth (INDEPENDENT_AMBULATORY_CARE_PROVIDER_SITE_OTHER): Payer: No Typology Code available for payment source | Admitting: Family Medicine

## 2020-08-21 ENCOUNTER — Encounter (INDEPENDENT_AMBULATORY_CARE_PROVIDER_SITE_OTHER): Payer: Self-pay | Admitting: Family Medicine

## 2020-08-21 DIAGNOSIS — R748 Abnormal levels of other serum enzymes: Secondary | ICD-10-CM

## 2020-08-21 DIAGNOSIS — N452 Orchitis: Secondary | ICD-10-CM

## 2020-08-21 NOTE — Progress Notes (Signed)
Have you seen any specialists/other providers since your last visit with us?    No    Arm preference verified?   No    The patient is due for  MD will assess

## 2020-08-21 NOTE — Progress Notes (Unsigned)
Subjective:      Patient ID: Jerry Hickman is a 65 y.o. male.    Chief Complaint:  Chief Complaint   Patient presents with   . Scan Review       HPI:  HPI      The patient understands that this is a virtual encounter due to the COVID pandemic and agrees to proceed.      Problem List:  Patient Active Problem List   Diagnosis   . History of sarcoidosis   . Essential hypertension, benign   . Mixed hyperlipidemia   . ED (erectile dysfunction)   . GERD (gastroesophageal reflux disease)   . Localized osteoarthrosis not specified whether primary or secondary, pelvic region and thigh   . Umbilical hernia without obstruction or gangrene       Current Medications:  Current Outpatient Medications   Medication Sig Dispense Refill   . atorvastatin (LIPITOR) 20 MG tablet Take 1 tablet (20 mg total) by mouth daily 90 tablet 3   . pantoprazole (PROTONIX) 20 MG tablet Take 1 tablet (20 mg total) by mouth daily 90 tablet 3   . sildenafil (VIAGRA) 50 MG tablet TAKE 1 TABLET BY MOUTH AS NEEDED FOR ERECTILE DYSFUNCTION 10 tablet 1   . valsartan (DIOVAN) 80 MG tablet Take 1 tablet (80 mg total) by mouth daily 90 tablet 3     No current facility-administered medications for this visit.       Allergies:  No Known Allergies    Past Medical History:  Past Medical History:   Diagnosis Date   . Abnormal vision     reading   . Arthritis     L hip   . Avascular necrosis    . Chronic hip pain    . Depression screening negative 12-01-12    Neg   . Difficulty in walking(719.7)     limp .Marland KitchenMarland Kitchenpending surgery   . GERD (gastroesophageal reflux disease)     on med   . H/O splenomegaly     Denies this condition   . Headache following lumbar puncture 2004    s/p spinal tap .... blood patch needed   . Hyperlipidemia     on med   . Hypertensive disorder     on med   . Sarcoid     treated with steroids       Past Surgical History:  Past Surgical History:   Procedure Laterality Date   . ARTHROPLASTY, HIP, TOTAL, ANTERIOR APPROACH, C ARM  03/10/2013     Procedure: ARTHROPLASTY, HIP, TOTAL, ANTERIOR APPROACH, C ARM;  Surgeon: Cynda Familia, MD;  Location: [facility];  Service: Orthopedics;  Laterality: Left;  LEFT HIP THA ANT     . COLONOSCOPY  2014   . JOINT REPLACEMENT      hip and knee   . REPLACEMENT TOTAL KNEE  01-18-11    right   . TOTAL HIP ARTHROPLASTY  11-2004    Right       Family History:  Family History   Problem Relation Age of Onset   . Cancer Mother         ovarian   . Heart attack Father    . Heart disease Father    . Hyperlipidemia Father    . Hypertension Father    . Cancer Sister         breast cancer       Social History:  Social History  Socioeconomic History   . Marital status: Divorced   Tobacco Use   . Smoking status: Never Smoker   . Smokeless tobacco: Never Used   Vaping Use   . Vaping Use: Never used   Substance and Sexual Activity   . Alcohol use: Yes     Alcohol/week: 3.3 standard drinks     Types: 3 Glasses of wine per week   . Drug use: No   Social History Narrative    The patient is to Aggie Cosier and has been since 1990, with 2 sons and one girl.  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        The following sections were reviewed this encounter by the provider:   Tobacco  Allergies  Meds  Problems  Med Hx  Surg Hx  Fam Hx           ROS:  Review of Systems    Vitals:  There were no vitals taken for this visit.     Objective:     Physical Exam:  Physical Exam     Assessment:     There are no diagnoses linked to this encounter.    Plan:     ***    Chrisandra Carota, MD

## 2020-08-22 ENCOUNTER — Encounter (INDEPENDENT_AMBULATORY_CARE_PROVIDER_SITE_OTHER): Payer: Self-pay

## 2020-08-22 ENCOUNTER — Telehealth: Payer: Self-pay | Admitting: Family Medicine

## 2020-08-22 NOTE — Telephone Encounter (Signed)
Please see message below.    Thank you,

## 2020-08-22 NOTE — Telephone Encounter (Signed)
Pt calls stating he was referred to Dr. Dory Peru a urologist but they do not have openings until 01/2021.    Pt is requesting for a different referral to a different urologist.  Please advise.

## 2020-08-22 NOTE — Telephone Encounter (Signed)
Please give him the name of Dr. Edwyna Shell with [facility].

## 2020-08-22 NOTE — Telephone Encounter (Signed)
Spoke to pt and advised of information. Sent pt a portal message with Dr.Hart's information.

## 2020-08-29 ENCOUNTER — Encounter (INDEPENDENT_AMBULATORY_CARE_PROVIDER_SITE_OTHER): Payer: Self-pay | Admitting: Family Medicine

## 2020-08-29 ENCOUNTER — Telehealth (INDEPENDENT_AMBULATORY_CARE_PROVIDER_SITE_OTHER): Payer: Self-pay | Admitting: Family Medicine

## 2020-08-29 NOTE — Telephone Encounter (Signed)
Pt is calling stating the 2nd urologist he was referred to (Dr. Kendall Flack) does not have anything until 10/30/2020. Pt booked the appt but states his testicles continue to swell larger and is asking if there is another urologist with something sooner. Please advise.      T: [phone]

## 2020-08-29 NOTE — Telephone Encounter (Signed)
Please see below       Thank you   Kale Dols Arnold LPN

## 2020-08-30 ENCOUNTER — Ambulatory Visit (INDEPENDENT_AMBULATORY_CARE_PROVIDER_SITE_OTHER): Payer: No Typology Code available for payment source | Admitting: Urology

## 2020-08-30 VITALS — BP 162/95 | HR 60 | Ht 68.5 in | Wt 252.0 lb

## 2020-08-30 DIAGNOSIS — N432 Other hydrocele: Secondary | ICD-10-CM

## 2020-08-30 DIAGNOSIS — N453 Epididymo-orchitis: Secondary | ICD-10-CM

## 2020-08-30 LAB — URINALYSIS POC
Blood, UA POCT: NEGATIVE
POCT Urine Bilirubin: NEGATIVE
POCT Urine Glucose: NEGATIVE mg/dL
POCT Urine Ketones: NEGATIVE mg/dL
POCT Urine Nitrites: NEGATIVE
POCT Urine Urobilibogen: 0.2 mg/dL (ref 0.0–1.0)
POCT Urine pH: 7 (ref 5.0–8.0)
Protein, UR POCT: NEGATIVE mg/dL
Urine Specific Gravity POC: 1.015 (ref 1.001–1.035)
Urine leukocyte Esterase, POCT: NEGATIVE

## 2020-08-30 NOTE — Telephone Encounter (Signed)
The patient was seen today by Dr. Dory Peru

## 2020-08-30 NOTE — Patient Instructions (Signed)
Follow up as needed

## 2020-08-30 NOTE — Progress Notes (Signed)
 Subjective:      Patient ID: Jerry Hickman is a 65 y.o. male     Chief Complaint:  08/30/2020  Left scrotal enlargement associated with left epididymoorchitis, initially presenting with moderate left testicular discomfort treated with doxycycline .  On scrotal ultrasound he is noted to have findings of mild left testicular enlargement without hypervascularity with a complex left hydrocele and a smaller right simple hydrocele.  The pain is improved with treatment and swelling remains consistently present.  No change in voiding pattern/symptoms.    The following portions of the patient's history were reviewed and updated as appropriate: allergies, current medications, past family history, past medical history, past social history, past surgical history and problem list.    Review of Systems  History obtained from the patient  General ROS: negative for - chills, fatigue, fever or weight loss  Respiratory ROS: no cough, shortness of breath, or wheezing  Cardiovascular ROS: no chest pain or dyspnea on exertion  Gastrointestinal ROS: no abdominal pain, change in bowel habits, or black or bloody stools  Genito-Urinary ROS: positive for - nocturia      Objective:   BP (!) 162/95 (BP Site: Left arm, Patient Position: Sitting, Cuff Size: Large)   Pulse 60   Ht 1.74 m (5' 8.5")   Wt 114.3 kg (252 lb)   BMI 37.75 kg/m   General appearance - alert, well appearing, and in no distress  Mental status - alert, oriented to person, place, and time, appropriate affect  Skin: Normal temperature, turgor and texture; no rash or ulcers  Neck - Supple, symmetrical, trachea midline, NROM, no adenopathy    Chest - Normal chest wall excursion; symmetric air entry without wheeze or rales  Heart - normal rate and regular rhythm  Abdomen - soft, nontender, nondistended, no masses or organomegaly  GU Male - no penile lesions or discharge, no testicular masses or tenderness, no hernias  Neurologic -  Normal strength, sensation and reflexes  throughout  Musculoskeletal - no joint tenderness, deformity or swelling  Extremities - no pedal edema, no clubbing or cyanosis      Lab Review    Latest Reference Range & Units 08/30/20 10:34   POCT Urine Bilirubin Negative  Negative   POCT Urine Clarity Clear - Hazy  Clear   POCT Urine Color Yellow  Yellow   POCT Urine Glucose Negative mg/dL Negative   POCT Urine Ketones Negative mg/dL Negative   Urine leukocyte Esterase, POCT Negative  Negative   POCT Urine Nitrites Negative  Negative   POCT Urine pH 5.0 - 8.0  7.0   Urine Specific Gravity POC 1.001 - 1.035  1.015   POCT Urine Urobilibogen 0.0 - 1.0 mg/dL 0.2       Radiology Review   US  SCROTUM AND TESTICLES  08/09/2020  HISTORY: Enlarged left testicle, orchitis, patient reports left scrotal  swelling for about 2 weeks without pain, some skin redness reported     COMPARISON: None.     FINDINGS:     Measurements:  Right testis: 4.1 x 2.3 x 2.6 cm  Left testis: 5 x 3.1 x 3 cm     The testes have normal size and echotexture and are normal in location.  No masses. Color and spectral Doppler interrogation demonstrate normal  and symmetric arterial and venous flow within each testis.     No epididymal enlargement. There is a 0.7 cm right scrotal calcification  or pearl. There is a small simple right hydrocele. There is  a  moderate-sized left hydrocele containing low-level echoes.     IMPRESSION:         1. Asymmetric testicular size with the left slightly larger than the  right. Although potentially due to orchitis, there is no  hypervascularity on the left.  2. Small simple right hydrocele.  3. Moderate complex left hydrocele.    Assessment:     1. Other hydrocele - LEFT    2. Orchitis and epididymitis      Hydrocele    A hydrocele is a collection of fluid contained in the sac (tunica vaginalis) surrounding the front of the testicle.  The fluid is normally there to provide lubrication for the testicle within the scrotum, but when the balance between fluid production and  reabsorption favors accumulation, a hydrocele is formed.  A hydrocele is usually painless, transilluminates with light and characteristically surrounds the testicle, making complete examination of the testicle difficult.  Hydroceles are almost always benign and do not adversely affect the testicles or scrotum.      Other diagnoses that may present similarly and have been considered are spermatocele, inguinal hernia, testicular tumor and scrotal wall edema.    Treatment considerations are based on bothersome symptoms (such as bulk, irritation or pain) of a hydrocele and overall health status to tolerate treatment.  Management options include:    Observation  Most hydroceles are do not cause bothersome symptoms and will remain unchanged for years.  Observation is a reasonably low risk option.    Aspiration (drainage with a needle)  Aspiration can be performed in the office with local anesthesia.  It will immediately reduce the size of the hydrocele.  Aspiration is usually only a temporary measure as most hydroceles will re-accumulate, often larger than before.  Risks of needle aspiration are bleeding (hematocele or hematoma) and infection (superficial skin or abscess) that may require immediate surgical treatment.    Hydrocelectomy (surgery)  Hydrocelectomy is the most definitive option.  During this surgical procedure an incision is made in through the scrotum the fluid around the testicle is drained and the sac is partially removed.   General or regional anesthesia is often required for patient comfort during the procedure.  Risks include bleeding (hematoma or hematocele), infection (superficial skin or abscess), excessive swelling of the testicle or epididymis and injury to the testicle/epididymis/vas/spermatic cord.  The degree of swelling and pain after surgery determine the duration of recovery. Up to three weeks of recovery time is not uncommon although most will feel that they are able to return to work within  a week after surgery.    He is comfortable with expectant management.    Plan:     Patient Instructions   Follow-up as needed.        Orders  Orders Placed This Encounter   Procedures    Urinalysis POC

## 2020-09-03 ENCOUNTER — Encounter (INDEPENDENT_AMBULATORY_CARE_PROVIDER_SITE_OTHER): Payer: Self-pay | Admitting: Urology

## 2020-09-11 ENCOUNTER — Telehealth (INDEPENDENT_AMBULATORY_CARE_PROVIDER_SITE_OTHER): Payer: Self-pay | Admitting: Urology

## 2020-09-11 NOTE — Telephone Encounter (Signed)
Jerry Hickman,    Im Dr. Hillis Range MA. [name] is only my coverage for PTO. NP's do not perform Hydrocele aspirations so I will add Dr. Dory Peru to this for his response

## 2020-09-11 NOTE — Telephone Encounter (Signed)
Pt was last seen by Dr. Dory Peru on 5/25 for hydrocele and pt stated that symptoms have worsen and would like for the liquid to be extracted according to his last conversation with the doctor. Pt is aware that Dr. Dory Peru is fully booked until late October and would like to know if this procedure can be done by a NP because he can't wait that long to be seen. Please advise pt. TY

## 2020-09-12 NOTE — Telephone Encounter (Signed)
Np! You're welcome!

## 2020-09-13 ENCOUNTER — Encounter (INDEPENDENT_AMBULATORY_CARE_PROVIDER_SITE_OTHER): Payer: Self-pay

## 2020-09-14 ENCOUNTER — Telehealth (INDEPENDENT_AMBULATORY_CARE_PROVIDER_SITE_OTHER): Payer: Self-pay | Admitting: Urology

## 2020-09-14 ENCOUNTER — Encounter (INDEPENDENT_AMBULATORY_CARE_PROVIDER_SITE_OTHER): Payer: Self-pay

## 2020-09-14 NOTE — Telephone Encounter (Signed)
Pt called again to follow up on his request to be seen by Dr. Dory Peru or another physician. Pt hydrocele condition is getting worse by the day. Please contact pt and assist. TY

## 2020-09-18 ENCOUNTER — Ambulatory Visit (INDEPENDENT_AMBULATORY_CARE_PROVIDER_SITE_OTHER): Payer: No Typology Code available for payment source | Admitting: Urology

## 2020-09-18 ENCOUNTER — Encounter (INDEPENDENT_AMBULATORY_CARE_PROVIDER_SITE_OTHER): Payer: Self-pay | Admitting: Urology

## 2020-09-18 DIAGNOSIS — N432 Other hydrocele: Secondary | ICD-10-CM

## 2020-09-18 LAB — URINALYSIS POC
Blood, UA POCT: NEGATIVE
POCT Urine Bilirubin: NEGATIVE
POCT Urine Glucose: NEGATIVE mg/dL
POCT Urine Ketones: NEGATIVE mg/dL
POCT Urine Nitrites: NEGATIVE
POCT Urine Urobilibogen: 0.2 mg/dL (ref 0.0–1.0)
POCT Urine pH: 6 (ref 5.0–8.0)
Protein, UR POCT: NEGATIVE mg/dL
Urine Specific Gravity POC: 1.02 (ref 1.001–1.035)
Urine leukocyte Esterase, POCT: NEGATIVE

## 2020-09-18 NOTE — Patient Instructions (Signed)
Today we discussed a plan to proceed forward with scheduling a date to treat your left-sided hydrocele   -The surgery is called hydrocelectomy    Please call Fredricka Bonine at [phone] Ext [phone]  to schedule surgery          Hydrocele Surgery (Hydrocelectomy)  A hydrocele is a sac of fluid that forms around a testicle. It occurs when fluid builds up in the layer of tissue that covers the testicle. It may be caused by an infection or by injury to the testicle. But the cause is often not known. A large hydrocele can cause pain or swelling in the scrotum. Hydrocelectomy is surgery to remove the hydrocele.     Getting ready for surgery  Prepare for the surgery as you have been told. In addition:   Tell your healthcare provider about all medicines you take. This includes both prescription and over-the-counter medicines. This also includes vitamins, herbs, and other supplements. It also includes any blood thinners, such as warfarin, clopidogrel, or daily aspirin. You may need to stop taking some or all of them before surgery as instructed by your provider.  Follow any directions you are given for not eating or drinking before surgery.  The day of surgery  The procedure takes about 30 minutes. You will likely go home the same day.   Before the surgery   An IV (intravenous) line is put into a vein in your arm or hand. This line delivers fluids and medicine (such as antibiotics).  You are then given medicine (anesthesia) to keep you pain-free during the surgery. This may be general anesthesia, which puts you in a deep sleep-like state through the surgery. A tube may be put into your throat to help you breathe.  Local anesthesia or numbing medicine may be given to help control post-surgery pain. The doctor, anesthesiologist, or nurse anesthetist can tell you more.  During the surgery  A cut (incision) is made in the scrotum.  The hydrocele is drained of fluid. The tissue that forms the sac around the hydrocele is removed or  repositioned. This helps prevent fluid from building up again.  A thin tube (drain) may be placed in the incision to let fluid drain. This tube is removed a few days later in the office.  The incision in the scrotum is closed with stitches or surgical strips.  After the surgery  You will be taken to a room to recover from the anesthesia. A nurse will watch you and make sure you're not in pain. You may feel sleepy and nauseated. If a breathing tube was used, your throat may be sore at first. An ice pack may be put on the surgical area. This helps reduce swelling. You may also be given a jockstrap to wear. This helps ease pain and swelling, and prevents injury. Once you are ready to go home, have an adult family member or friend drive you.   Recovering at home  Follow the instructions you have been given to care for yourself. During your recovery:   Apply an ice pack or cold compress to the scrotum as directed to help reduce swelling. Do this for no longer than 15 minutes at a time. To make an ice pack, put ice cubes in a plastic bag that seals at the top. Wrap the bag in a clean, thin towel or cloth. Never put ice or an ice pack right on the skin. Continue using the cold pack for 2 days or until swelling improves.  Take prescribed pain medicine as directed.  Care for your incision as instructed.  Follow your healthcare provider's guidelines for showering. Don't swim, bathe, use a hot tub, or do other things that will cover the incision with water until your provider says it's OK.  Wear a jockstrap or snug underwear as directed.  Don't lift anything heavy. Exercise as directed.  Don't have sex for 4 weeks, or as directed.  Don't drive until you are no longer taking pain medicine and your provider says it's OK.  When to call your healthcare provider  Call your healthcare provider if you have any of the following:   Fever of 100.4 F ( 38C ) or higher, or as directed by your healthcare provider  Symptoms of infection  at the incision site such as:  More redness or swelling  Warmth  Pain that gets worse  Bad-smelling drainage  Bleeding from the incision site  Pain gets worse or is not eased by pain medicine  More pain or swelling in the scrotum or groin area  Call 911  Call 911 right away if you have:   Chest pain  Trouble breathing    Follow-up care  You will have follow-up visits with your healthcare provider to check on your healing. You may also have stitches or a surgical drain that needs to be removed. Be sure to tell your provider if you have any questions or concerns about your recovery.   Risks and possible complications  All procedures have risks. Possible risks of this procedure include:   Bleeding  Infection  Blood clots  The hydrocele comes back  Injury to the testicle and nearby structures, which can lead to infertility  Risks of anesthesia (the anesthesiologist or nurse anesthetist will talk about these with you)  StayWell last reviewed this educational content on 03/08/2018     2000-2022 [employer], [place]. All rights reserved. This information is not intended as a substitute for professional medical care. Always follow your healthcare professional's instructions.

## 2020-09-19 ENCOUNTER — Other Ambulatory Visit (INDEPENDENT_AMBULATORY_CARE_PROVIDER_SITE_OTHER): Payer: Self-pay | Admitting: Urology

## 2020-09-19 DIAGNOSIS — N432 Other hydrocele: Secondary | ICD-10-CM

## 2020-09-19 NOTE — Progress Notes (Signed)
Subjective:      Patient ID: Jerry Hickman is a 65 y.o. male     Chief Complaint:  1. Left hydrocele  2. Scrotal discomfort    This is a very pleasant 65 year old man.  He originally met Dr. Dory Peru last month, due to new symptoms of left-sided scrotal swelling.  He was originally evaluated for this in early May with an ultrasound, and this confirmed the presence of a complex left hydrocele of moderate size.  Initially, as he had been having some discomfort on that side, he had been treated with a 10-day course of doxycycline for presumed epididymoorchitis.  At this time, he is not experiencing any fevers or chills, skin redness, or significant tenderness to touch.  He does endorse that his hydrocele has become larger and more bothersome, and he is interested in some treatment for this.    The following portions of the patient's history were reviewed and updated as appropriate: allergies, current medications, past family history, past medical history, past social history, past surgical history and problem list.    Review of Systems  ROS: Pertinent ROS as noted above. Otherwise negative or non-contributory.       Objective:   BP 147/80 (BP Site: Left arm, Patient Position: Sitting, Cuff Size: Large)    Pulse 73    Ht 1.753 m (5\' 9" )    Wt 114.8 kg (253 lb)    BMI 37.36 kg/m   General appearance - alert, well appearing, and in no distress  Mental status - alert, oriented to person, place, and time  GU - firm moderate sized left hydrocele, no fluctuance, no scrotal rash or erythema or ulceration, no TTP      Lab Review   Urine analysis shows Negative    Radiology Review   US Scrotum and Testicles w Doppler Ltd [[id number]] (Order [id number])    Status: Final result               Study Result    Narrative & Impression   HISTORY: Enlarged left testicle, orchitis, patient reports left scrotal  swelling for about 2 weeks without pain, some skin redness reported    COMPARISON: None.    FINDINGS:    Measurements:  Right  testis: 4.1 x 2.3 x 2.6 cm  Left testis: 5 x 3.1 x 3 cm    The testes have normal size and echotexture and are normal in location.  No masses. Color and spectral Doppler interrogation demonstrate normal  and symmetric arterial and venous flow within each testis.    No epididymal enlargement. There is a 0.7 cm right scrotal calcification  or pearl. There is a small simple right hydrocele. There is a  moderate-sized left hydrocele containing low-level echoes.    IMPRESSION:        1. Asymmetric testicular size with the left slightly larger than the  right. Although potentially due to orchitis, there is no  hypervascularity on the left.  2. Small simple right hydrocele.  3. Moderate complex left hydrocele.    These results were faxed to the referring physician's office following  the study.    Impression given to [place] So, MA.  Completed: Senaida Ores Abbi,08/09/2020 4:05 PM    Will Bonnet, MD   08/09/2020 4:12 PM         Assessment:     1. Other hydrocele - LEFT      Today we had a discussion about treatment strategies for his left hydrocele.  We discussed  the differences between aspiration and definitive surgical repair in the form of hydrocelectomy.  We discussed that aspiration has fairly high recurrence rates, whereas hydrocelectomy does still have 5 to 10% recurrence rate, but a much higher success rate.  At this point, his hydrocele is becoming larger and more uncomfortable.  We did discuss that these can still spontaneously resolve, particularly if this occurred in the setting of an epididymal orchitis episode, however this is beginning to seem less likely.  Discussed moving forward with a plan to schedule hydrocelectomy and discussed the risks of this procedure including but not limited to bleeding, infection, recurrence of the hydrocele, expected postoperative swelling, and others.  We will plan to schedule this for him following his upcoming vacation.      Plan:     Patient Instructions   Today we  discussed a plan to proceed forward with scheduling a date to treat your left-sided hydrocele   -The surgery is called hydrocelectomy    Please call Fredricka Bonine at [phone] Ext [phone]  to schedule surgery          Hydrocele Surgery (Hydrocelectomy)  A hydrocele is a sac of fluid that forms around a testicle. It occurs when fluid builds up in the layer of tissue that covers the testicle. It may be caused by an infection or by injury to the testicle. But the cause is often not known. A large hydrocele can cause pain or swelling in the scrotum. Hydrocelectomy is surgery to remove the hydrocele.     Getting ready for surgery  Prepare for the surgery as you have been told. In addition:    Tell your healthcare provider about all medicines you take. This includes both prescription and over-the-counter medicines. This also includes vitamins, herbs, and other supplements. It also includes any blood thinners, such as warfarin, clopidogrel, or daily aspirin. You may need to stop taking some or all of them before surgery as instructed by your provider.   Follow any directions you are given for not eating or drinking before surgery.  The day of surgery  The procedure takes about 30 minutes. You will likely go home the same day.   Before the surgery    An IV (intravenous) line is put into a vein in your arm or hand. This line delivers fluids and medicine (such as antibiotics).   You are then given medicine (anesthesia) to keep you pain-free during the surgery. This may be general anesthesia, which puts you in a deep sleep-like state through the surgery. A tube may be put into your throat to help you breathe.   Local anesthesia or numbing medicine may be given to help control post-surgery pain. The doctor, anesthesiologist, or nurse anesthetist can tell you more.  During the surgery   A cut (incision) is made in the scrotum.   The hydrocele is drained of fluid. The tissue that forms the sac around the hydrocele is removed  or repositioned. This helps prevent fluid from building up again.   A thin tube (drain) may be placed in the incision to let fluid drain. This tube is removed a few days later in the office.   The incision in the scrotum is closed with stitches or surgical strips.  After the surgery  You will be taken to a room to recover from the anesthesia. A nurse will watch you and make sure youre not in pain. You may feel sleepy and nauseated. If a breathing tube was used, your throat may be  sore at first. An ice pack may be put on the surgical area. This helps reduce swelling. You may also be given a jockstrap to wear. This helps ease pain and swelling, and prevents injury. Once you are ready to go home, have an adult family member or friend drive you.   Recovering at home  Follow the instructions you have been given to care for yourself. During your recovery:    Apply an ice pack or cold compress to the scrotum as directed to help reduce swelling. Do this for no longer than 15 minutes at a time. To make an ice pack, put ice cubes in a plastic bag that seals at the top. Wrap the bag in a clean, thin towel or cloth. Never put ice or an ice pack right on the skin. Continue using the cold pack for 2 days or until swelling improves.   Take prescribed pain medicine as directed.   Care for your incision as instructed.   Follow your healthcare providers guidelines for showering. Don't swim, bathe, use a hot tub, or do other things that will cover the incision with water until your provider says its OK.   Wear a jockstrap or snug underwear as directed.   Don't lift anything heavy. Exercise as directed.   Don't have sex for 4 weeks, or as directed.   Don't drive until you are no longer taking pain medicine and your provider says its OK.  When to call your healthcare provider  Call your healthcare provider if you have any of the following:   1. Fever of 100.4 F ( 38C ) or higher, or as directed by your healthcare  provider  2. Symptoms of infection at the incision site such as:  ? More redness or swelling  ? Warmth  ? Pain that gets worse  ? Bad-smelling drainage  3. Bleeding from the incision site  4. Pain gets worse or is not eased by pain medicine  5. More pain or swelling in the scrotum or groin area  Call 911  Call 911 right away if you have:    Chest pain   Trouble breathing    Follow-up care  You will have follow-up visits with your healthcare provider to check on your healing. You may also have stitches or a surgical drain that needs to be removed. Be sure to tell your provider if you have any questions or concerns about your recovery.   Risks and possible complications  All procedures have risks. Possible risks of this procedure include:    Bleeding   Infection   Blood clots   The hydrocele comes back   Injury to the testicle and nearby structures, which can lead to infertility   Risks of anesthesia (the anesthesiologist or nurse anesthetist will talk about these with you)  StayWell last reviewed this educational content on 03/08/2018     2000-2022 [employer], [place]. All rights reserved. This information is not intended as a substitute for professional medical care. Always follow your healthcare professional's instructions.              Orders  Orders Placed This Encounter   Procedures    Urinalysis POC

## 2020-09-28 DIAGNOSIS — N453 Epididymo-orchitis: Secondary | ICD-10-CM | POA: Insufficient documentation

## 2020-10-05 ENCOUNTER — Other Ambulatory Visit (INDEPENDENT_AMBULATORY_CARE_PROVIDER_SITE_OTHER): Payer: Self-pay | Admitting: Urology

## 2020-10-05 ENCOUNTER — Telehealth: Payer: Self-pay

## 2020-10-05 DIAGNOSIS — N432 Other hydrocele: Secondary | ICD-10-CM

## 2020-10-05 NOTE — Telephone Encounter (Signed)
The [employer] coordinator made an outreach call to patient to see if writer could be of any assistance in helping them manage their healthcare needs. Left voicemail with contact information to return call.       Jodine Muchmore G. Coordinator  [facility] [facility]

## 2020-10-13 ENCOUNTER — Encounter (INDEPENDENT_AMBULATORY_CARE_PROVIDER_SITE_OTHER): Payer: Self-pay

## 2020-10-14 ENCOUNTER — Encounter (INDEPENDENT_AMBULATORY_CARE_PROVIDER_SITE_OTHER): Payer: Self-pay

## 2020-10-16 ENCOUNTER — Other Ambulatory Visit (INDEPENDENT_AMBULATORY_CARE_PROVIDER_SITE_OTHER): Payer: Self-pay | Admitting: Family Medicine

## 2020-10-16 DIAGNOSIS — K219 Gastro-esophageal reflux disease without esophagitis: Secondary | ICD-10-CM

## 2020-10-19 ENCOUNTER — Telehealth: Payer: Self-pay

## 2020-10-19 NOTE — Telephone Encounter (Signed)
The [employer] coordinator made an outreach call to patient to see if writer could be of any assistance in helping them manage their healthcare needs. Patient declined services at this time, writer advised patient to call [employer] 24 hour Nurse Line if there are any questions or concerns.        Jerry Hickman G. Coordinator  [facility] [facility]

## 2020-10-26 ENCOUNTER — Ambulatory Visit: Payer: No Typology Code available for payment source

## 2020-10-26 NOTE — Pre-Procedure Instructions (Signed)
Important Instructions Before Your Procedure        Your case is currently scheduled for 1000 11/08/2020 at [facility] with Andrey Campanile, Luevenia Maxin, HOLT, BRETT F.      Park in [place] garage/valet parking available [facility])-arrive 2 hours early    Day of procedure contact: [facility] [phone].     The date and/or time of your surgery may change.  Your surgeon's office will notify you, up until the business day before surgery, if there is any change to your surgery date or time.  Please don't hesitate to call your surgeon's office directly with any questions.        IMPORTANT You must visit the Preparing for Your Procedure guide link below for additional instructions including fasting guidelines and directions before your procedure. If you received fasting or skin preparation instructions from your surgeon or pre-procedural provider, please follow those specific instructions. The instructions here are general instructions that do not pertain to all patients.  [email or web address]    If the link doesn't open, please copy and paste in your browser

## 2020-10-26 NOTE — PSS Phone Screening (Signed)
Pre-Anesthesia Evaluation    Pre-op phone visit requested by:   Reason for pre-op phone visit: Patient anticipating (LEFT) HYDROCELECTOMY procedure.    History of Present Illness/Summary:        Problem List:  Medical Problems       Hospital Problem List  Date Reviewed: 09/18/2020   None        Non-Hospital Problem List  Date Reviewed: 09/18/2020            ICD-10-CM Priority Class Noted    History of sarcoidosis Z86.2   10/04/2011    Essential hypertension, benign I10   10/04/2011    Mixed hyperlipidemia E78.2   10/04/2011    ED (erectile dysfunction) N52.9   10/04/2011    GERD (gastroesophageal reflux disease) K21.9   12/01/2012    Localized osteoarthrosis not specified whether primary or secondary, pelvic region and thigh M16.10   03/10/2013    Umbilical hernia without obstruction or gangrene K42.9   10/26/2019    Other hydrocele - LEFT N43.2   09/19/2020    Orchitis and epididymitis N45.3   09/28/2020        Medical History   Diagnosis Date    Abnormal vision     reading    Arthritis     L hip    Avascular necrosis     Chronic hip pain     Depression screening negative 12-01-12    Neg    Difficulty in walking(719.7)     limp .Marland KitchenMarland Kitchenpending surgery    GERD (gastroesophageal reflux disease)     on med    H/O splenomegaly     Denies this condition    Headache following lumbar puncture 2004    s/p spinal tap .... blood patch needed    Hyperlipidemia     on med    Hypertensive disorder     on med    Sarcoid     treated with steroids     Past Surgical History:   Procedure Laterality Date    ARTHROPLASTY, HIP, TOTAL, ANTERIOR APPROACH, C ARM  03/10/2013    Procedure: ARTHROPLASTY, HIP, TOTAL, ANTERIOR APPROACH, C ARM;  Surgeon: Cynda Familia, MD;  Location: [facility];  Service: Orthopedics;  Laterality: Left;  LEFT HIP THA ANT      COLONOSCOPY  04/08/2012    JOINT REPLACEMENT      hip and knee    REPLACEMENT TOTAL KNEE  01/18/2011    right    ROTATOR CUFF REPAIR Left     TOTAL HIP ARTHROPLASTY  11/06/2004    Right        Current Outpatient Medications:     atorvastatin (LIPITOR) 20 MG tablet, Take 1 tablet (20 mg total) by mouth daily, Disp: 90 tablet, Rfl: 3    pantoprazole (PROTONIX) 20 MG tablet, TAKE 1 TABLET BY MOUTH EVERY DAY, Disp: 90 tablet, Rfl: 3    sildenafil (VIAGRA) 50 MG tablet, TAKE 1 TABLET BY MOUTH AS NEEDED FOR ERECTILE DYSFUNCTION, Disp: 10 tablet, Rfl: 1    valsartan (DIOVAN) 80 MG tablet, Take 1 tablet (80 mg total) by mouth daily, Disp: 90 tablet, Rfl: 3     Medication List            Accurate as of October 26, 2020 10:44 AM. Always use your most recent med list.                atorvastatin 20 MG tablet  Take 1 tablet (20 mg  total) by mouth daily  Commonly known as: LIPITOR  Medication Adjustments for Surgery: Take morning of surgery     pantoprazole 20 MG tablet  TAKE 1 TABLET BY MOUTH EVERY DAY  Commonly known as: PROTONIX  Medication Adjustments for Surgery: Take morning of surgery     sildenafil 50 MG tablet  TAKE 1 TABLET BY MOUTH AS NEEDED FOR ERECTILE DYSFUNCTION  Commonly known as: VIAGRA  Medication Adjustments for Surgery: Hold day of surgery     valsartan 80 MG tablet  Take 1 tablet (80 mg total) by mouth daily  Commonly known as: DIOVAN  Medication Adjustments for Surgery: Hold day of surgery            No Known Allergies  Family History   Problem Relation Age of Onset    Cancer Mother         ovarian    Heart attack Father     Heart disease Father     Hyperlipidemia Father     Hypertension Father     Cancer Sister         breast cancer     Social History     Occupational History    Not on file   Tobacco Use    Smoking status: Never    Smokeless tobacco: Never   Vaping Use    Vaping Use: Never used   Substance and Sexual Activity    Alcohol use: Yes     Alcohol/week: 3.3 standard drinks     Types: 3 Glasses of wine per week    Drug use: No    Sexual activity: Not on file             Exam Scores:   SDB score Risk Category: No Risk    PONV score Nausea Risk: MODERATE RISK    MST score MST Score: 0     Allergy score      Frailty score         Visit Vitals  Ht 1.753 m (5\' 9" )   Wt 95.3 kg (210 lb)   BMI 31.01 kg/m       Recent Labs   CBC (last 180 days) 08/07/20  1027   WBC 3.99   Hgb 15.0   Hematocrit 44.1   Platelets 148     Recent Labs   BMP (last 180 days) 08/07/20  1027   Sodium 137   Potassium 4.2   Chloride 99*   CO2 29   BUN 19.0   Glucose 125*     Recent Labs   ABG (last 180 days) 08/07/20  1027   Hgb 15.0         Recent Labs   Other (last 180 days) 08/07/20  1027   ALT 128*   AST (SGOT) 61*   Protein, Total 6.9   Hemoglobin A1C 5.6

## 2020-11-08 ENCOUNTER — Ambulatory Visit: Payer: No Typology Code available for payment source | Admitting: Registered Nurse

## 2020-11-08 ENCOUNTER — Ambulatory Visit: Payer: Self-pay

## 2020-11-08 ENCOUNTER — Encounter
Admission: RE | Disposition: A | Discharge status: Home / Self Care | Payer: Self-pay | Source: Ambulatory Visit | Attending: Urology

## 2020-11-08 ENCOUNTER — Ambulatory Visit
Admission: RE | Admit: 2020-11-08 | Discharge: 2020-11-08 | Disposition: A | Discharge status: Home / Self Care | Payer: No Typology Code available for payment source | Source: Ambulatory Visit | Attending: Urology | Admitting: Urology

## 2020-11-08 DIAGNOSIS — K219 Gastro-esophageal reflux disease without esophagitis: Secondary | ICD-10-CM | POA: Insufficient documentation

## 2020-11-08 DIAGNOSIS — N433 Hydrocele, unspecified: Secondary | ICD-10-CM

## 2020-11-08 DIAGNOSIS — I10 Essential (primary) hypertension: Secondary | ICD-10-CM | POA: Insufficient documentation

## 2020-11-08 DIAGNOSIS — N432 Other hydrocele: Secondary | ICD-10-CM

## 2020-11-08 DIAGNOSIS — Z79899 Other long term (current) drug therapy: Secondary | ICD-10-CM | POA: Insufficient documentation

## 2020-11-08 HISTORY — PX: HYDROCELECTOMY, INGUINAL APPROACH: SHX4207

## 2020-11-08 SURGERY — HYDROCELECTOMY, INGUINAL APPROACH
Anesthesia: Anesthesia General | Site: Scrotum | Laterality: Left | Wound class: Clean

## 2020-11-08 MED ORDER — OXYCODONE HCL 5 MG PO TABS
5.0000 mg | ORAL_TABLET | Freq: Once | ORAL | Status: AC | PRN
Start: 2020-11-08 — End: 2020-11-08

## 2020-11-08 MED ORDER — BACITRACIN +/- ZINC 500 UNIT/GM EX OINT (WRAP)
TOPICAL_OINTMENT | CUTANEOUS | Status: DC | PRN
Start: 2020-11-08 — End: 2020-11-08
  Administered 2020-11-08: 1 via TOPICAL

## 2020-11-08 MED ORDER — GLYCOPYRROLATE 0.2 MG/ML IJ SOLN (WRAP)
INTRAMUSCULAR | Status: AC
Start: 2020-11-08 — End: ?
  Filled 2020-11-08: qty 1

## 2020-11-08 MED ORDER — LIDOCAINE 4 % EX CREA
TOPICAL_CREAM | Freq: Once | CUTANEOUS | Status: DC | PRN
Start: 2020-11-08 — End: 2020-11-08

## 2020-11-08 MED ORDER — FENTANYL CITRATE (PF) 50 MCG/ML IJ SOLN (WRAP)
INTRAMUSCULAR | Status: AC
Start: 2020-11-08 — End: ?
  Filled 2020-11-08: qty 2

## 2020-11-08 MED ORDER — ACETAMINOPHEN 500 MG PO TABS
1000.0000 mg | ORAL_TABLET | Freq: Once | ORAL | Status: DC | PRN
Start: 2020-11-08 — End: 2020-11-08

## 2020-11-08 MED ORDER — SULFAMETHOXAZOLE-TRIMETHOPRIM 800-160 MG PO TABS
1.0000 | ORAL_TABLET | Freq: Two times a day (BID) | ORAL | 0 refills | Status: AC
Start: 2020-11-08 — End: 2020-11-15

## 2020-11-08 MED ORDER — MIDAZOLAM HCL 1 MG/ML IJ SOLN (WRAP)
INTRAMUSCULAR | Status: AC
Start: 2020-11-08 — End: ?
  Filled 2020-11-08: qty 2

## 2020-11-08 MED ORDER — OXYCODONE HCL 5 MG PO TABS
5.0000 mg | ORAL_TABLET | Freq: Four times a day (QID) | ORAL | 0 refills | Status: AC | PRN
Start: 2020-11-08 — End: 2020-11-15

## 2020-11-08 MED ORDER — LIDOCAINE HCL 1 % IJ SOLN
INTRAMUSCULAR | Status: DC | PRN
Start: 2020-11-08 — End: 2020-11-08
  Administered 2020-11-08: 5 mL

## 2020-11-08 MED ORDER — CEFAZOLIN SODIUM 1 G IJ SOLR
2.0000 g | Freq: Once | INTRAMUSCULAR | Status: AC
Start: 2020-11-08 — End: 2020-11-08
  Administered 2020-11-08: 10:00:00 2 g via INTRAVENOUS

## 2020-11-08 MED ORDER — BUPIVACAINE HCL 0.5 % IJ SOLN
INTRAMUSCULAR | Status: DC | PRN
Start: 2020-11-08 — End: 2020-11-08
  Administered 2020-11-08: 5 mL

## 2020-11-08 MED ORDER — LIDOCAINE HCL (PF) 2 % IJ SOLN
INTRAMUSCULAR | Status: AC
Start: 2020-11-08 — End: ?
  Filled 2020-11-08: qty 5

## 2020-11-08 MED ORDER — FAMOTIDINE 10 MG/ML IV SOLN (WRAP)
20.0000 mg | Freq: Once | INTRAVENOUS | Status: AC
Start: 2020-11-08 — End: 2020-11-08
  Administered 2020-11-08: 10:00:00 20 mg via INTRAVENOUS

## 2020-11-08 MED ORDER — SULFAMETHOXAZOLE-TRIMETHOPRIM 800-160 MG PO TABS
1.0000 | ORAL_TABLET | Freq: Two times a day (BID) | ORAL | 0 refills | Status: DC
Start: 2020-11-08 — End: 2020-11-08

## 2020-11-08 MED ORDER — SODIUM CHLORIDE 0.9% BAG (IRRIGATION USE)
INTRAVENOUS | Status: DC | PRN
Start: 2020-11-08 — End: 2020-11-08
  Administered 2020-11-08: 1000 mL

## 2020-11-08 MED ORDER — LIDOCAINE HCL 2 % IJ SOLN
INTRAMUSCULAR | Status: DC | PRN
Start: 2020-11-08 — End: 2020-11-08
  Administered 2020-11-08: 50 mg via INTRAVENOUS

## 2020-11-08 MED ORDER — OXYCODONE HCL 5 MG PO TABS
ORAL_TABLET | ORAL | Status: AC
Start: 2020-11-08 — End: 2020-11-08
  Administered 2020-11-08: 12:00:00 5 mg via ORAL
  Filled 2020-11-08: qty 1

## 2020-11-08 MED ORDER — HYDROMORPHONE HCL 0.5 MG/0.5 ML IJ SOLN
0.5000 mg | INTRAMUSCULAR | Status: DC | PRN
Start: 2020-11-08 — End: 2020-11-08

## 2020-11-08 MED ORDER — PROPOFOL INFUSION 10 MG/ML
INTRAVENOUS | Status: DC | PRN
Start: 2020-11-08 — End: 2020-11-08
  Administered 2020-11-08: 18 ug/kg/min via INTRAVENOUS

## 2020-11-08 MED ORDER — HYDROCODONE-ACETAMINOPHEN 5-325 MG PO TABS
1.0000 | ORAL_TABLET | Freq: Once | ORAL | Status: DC | PRN
Start: 2020-11-08 — End: 2020-11-08

## 2020-11-08 MED ORDER — FAMOTIDINE 10 MG/ML IV SOLN (WRAP)
INTRAVENOUS | Status: AC
Start: 2020-11-08 — End: ?
  Filled 2020-11-08: qty 2

## 2020-11-08 MED ORDER — FENTANYL CITRATE (PF) 50 MCG/ML IJ SOLN (WRAP)
INTRAMUSCULAR | Status: DC | PRN
Start: 2020-11-08 — End: 2020-11-08
  Administered 2020-11-08: 50 ug via INTRAVENOUS

## 2020-11-08 MED ORDER — DEXAMETHASONE SODIUM PHOSPHATE 20 MG/5ML IJ SOLN
INTRAMUSCULAR | Status: AC
Start: 2020-11-08 — End: ?
  Filled 2020-11-08: qty 5

## 2020-11-08 MED ORDER — LACTATED RINGERS IV SOLN
INTRAVENOUS | Status: DC
Start: 2020-11-08 — End: 2020-11-08

## 2020-11-08 MED ORDER — GLYCOPYRROLATE 0.2 MG/ML IJ SOLN (WRAP)
INTRAMUSCULAR | Status: DC | PRN
Start: 2020-11-08 — End: 2020-11-08
  Administered 2020-11-08: .1 mg via INTRAVENOUS

## 2020-11-08 MED ORDER — PROPOFOL 10 MG/ML IV EMUL (WRAP)
INTRAVENOUS | Status: DC | PRN
Start: 2020-11-08 — End: 2020-11-08
  Administered 2020-11-08: 200 mg via INTRAVENOUS

## 2020-11-08 MED ORDER — PROMETHAZINE HCL 25 MG/ML IJ SOLN
6.2500 mg | Freq: Once | INTRAMUSCULAR | Status: DC | PRN
Start: 2020-11-08 — End: 2020-11-08

## 2020-11-08 MED ORDER — DEXAMETHASONE SODIUM PHOSPHATE 4 MG/ML IJ SOLN (WRAP)
INTRAMUSCULAR | Status: DC | PRN
Start: 2020-11-08 — End: 2020-11-08
  Administered 2020-11-08: 8 mg via INTRAVENOUS

## 2020-11-08 MED ORDER — ACETAMINOPHEN 500 MG PO TABS
1000.0000 mg | ORAL_TABLET | Freq: Once | ORAL | Status: AC
Start: 2020-11-08 — End: 2020-11-08
  Administered 2020-11-08: 10:00:00 1000 mg via ORAL

## 2020-11-08 MED ORDER — ONDANSETRON HCL 4 MG/2ML IJ SOLN
INTRAMUSCULAR | Status: AC
Start: 2020-11-08 — End: ?
  Filled 2020-11-08: qty 2

## 2020-11-08 MED ORDER — CEFAZOLIN SODIUM 2 G IJ SOLR
INTRAMUSCULAR | Status: AC
Start: 2020-11-08 — End: ?
  Filled 2020-11-08: qty 2000

## 2020-11-08 MED ORDER — ONDANSETRON HCL 4 MG/2ML IJ SOLN
INTRAMUSCULAR | Status: DC | PRN
Start: 2020-11-08 — End: 2020-11-08
  Administered 2020-11-08: 4 mg via INTRAVENOUS

## 2020-11-08 MED ORDER — ACETAMINOPHEN 500 MG PO TABS
ORAL_TABLET | ORAL | Status: AC
Start: 2020-11-08 — End: ?
  Filled 2020-11-08: qty 2

## 2020-11-08 MED ORDER — ONDANSETRON HCL 4 MG/2ML IJ SOLN
4.0000 mg | Freq: Once | INTRAMUSCULAR | Status: DC | PRN
Start: 2020-11-08 — End: 2020-11-08

## 2020-11-08 MED ORDER — LIDOCAINE HCL 1 % IJ SOLN
1.0000 mL | Freq: Once | INTRAMUSCULAR | Status: DC | PRN
Start: 2020-11-08 — End: 2020-11-08

## 2020-11-08 MED ORDER — PROPOFOL 10 MG/ML IV EMUL (WRAP)
INTRAVENOUS | Status: AC
Start: 2020-11-08 — End: ?
  Filled 2020-11-08: qty 50

## 2020-11-08 SURGICAL SUPPLY — 39 items
CONTAINER SPEC 8OZ NS SNPON LID TRNLU (Suction) ×2 IMPLANT
DRAIN PENROSE 18X.5IN STRL (Drain) IMPLANT
DRESSING TELFA 3X8IN STERILE (Dressing) ×2 IMPLANT
ELECTRODE ADULT PATIENT RETURN L9 FT REM POLYHESIVE ACRYLIC FOAM (Procedure Accessories) ×1 IMPLANT
ELECTRODE PATIENT RETURN L9 FT VALLEYLAB (Procedure Accessories) ×1 IMPLANT
ELECTRODE PT RTN RM PHSV ACRL FM C30- LB (Procedure Accessories) ×1
GLOVE SRG PLISPRN 6 BGL ULTRATOUCH G 285 (Glove) ×1
GLOVE SRG PLISPRN 6.5 BGL PI INDCTR (Glove) ×1
GLOVE SURGICAL 6 1/2 BIOGEL PI INDICATOR (Glove) ×1 IMPLANT
GLOVE SURGICAL 6 1/2 BIOGEL PI INDICATOR UNDERGLOVE POWDER FREE SMOOTH (Glove) ×1 IMPLANT
GLOVE SURGICAL 6 BIOGEL ULTRATOUCH G (Glove) ×1 IMPLANT
GLOVE SURGICAL 6 BIOGEL ULTRATOUCH G POWDER FREE ROUGH BEAD CUFF (Glove) ×1 IMPLANT
GOWN STNDRD LG (W/ PAPER TWL) (Gown) ×2 IMPLANT
GOWN SURGICAL STANDARD IMPERVIOUS LARGE GREEN 1080B (Gown) ×1 IMPLANT
SOLUTION SRGSCRB 10% PVP IOD 4OZ PLS BTL (Scrub Supplies) ×2 IMPLANT
SOLUTION SURGICAL SCRUB 10% PVP IODINE 4OZ PLASTIC BOTTLE AQUEOUS SKIN (Scrub Supplies) ×1 IMPLANT
SPONGE GAUZE L6 3/4 IN X W6 IN MEDIUM (Dressing) ×1 IMPLANT
SPONGE GAUZE L6 3/4 IN X W6 IN MEDIUM ABSORBENT FLUFF DRY CRINKLE (Dressing) ×1 IMPLANT
SPONGE GZE CTTN MED KRLX 6.75X6IN LF (Dressing) ×1
SPONGE PEANUT C5 HOLDER DISSECTOR XRAY (Sponge) IMPLANT
SPONGE PEANUT C5 HOLDER DISSECTOR XRAY DETECTABLE OD3/8 IN DUKAL (Sponge) IMPLANT
SPONGE PEANUT RADPQE STRL3/8IN (Sponge)
SUPPORT ATHL LG 39-44IN 18.3X11.3IN ELC (Patient Supply)
SUPPORT ATHLETIC L18.3 IN X W11.3 IN (Patient Supply) IMPLANT
SUPPORT SWIM MED REST 33-38IN (Patient Supply) IMPLANT
SUTURE ABS 3-0 SH VCL 27IN BRD COAT UD (Suture)
SUTURE ABS CR 3-0 PS2 MTPS 27IN MFL BRN (Suture) ×1
SUTURE ABS CR 3-0 SH 27IN MFL BRN (Suture) ×1
SUTURE CHROMIC GUT CHROMIC 3-0 PS-2 L27 (Suture) ×1 IMPLANT
SUTURE CHROMIC GUT CHROMIC 3-0 SH L27 IN (Suture) ×1 IMPLANT
SUTURE CHROMIC GUT CHROMIC 3-0 SH L27 IN MONOFILAMENT BROWN ABSORBABLE (Suture) ×1 IMPLANT
SUTURE COATED VICRYL 3-0 SH L27 IN BRAID (Suture) IMPLANT
TRAY PREOPERATIVE SKIN DRY SCRUB (Tray) ×2 IMPLANT
TRAY SRG LF STRL MAJ DISP (Pack) ×1
TRAY SURGICAL MAJOR (Pack) ×1 IMPLANT
TRAY SURGICAL MEDLINE INDUSTRIES, INC. MAJOR DYNJ63599A (Pack) ×1 IMPLANT
TUBING CONNECTING STERILE 10FT (Tubing) ×1
TUBING SUCTION ID3/16 IN L10 FT (Tubing) ×1 IMPLANT
TUBING SUCTION ID3/16 IN L10 FT NONCONDUCTIVE STRAIGHT MALE FEMALE (Tubing) ×1 IMPLANT

## 2020-11-08 NOTE — Brief Op Note (Signed)
BRIEF OP NOTE    Date Time: 11/08/20 11:44 AM    Patient Name:   Jerry Hickman    Date of Operation:   11/08/2020    Providers Performing:   Surgeon(s):  Murray Hodgkins, MD  Miquel Dunn, MD    Assistant (s):   Circulator: Merrily Pew, RN  Relief Scrub: Raye Sorrow, RN  Scrub Person: Webb Laws  Team Leader: Yetta Barre, RN  Preceptor: Ernestina Columbia B    Operative Procedure:   Procedure(s):  (LEFT) HYDROCELECTOMY    Preoperative Diagnosis:   Pre-Op Diagnosis Codes:     * Other hydrocele [N43.2]    Postoperative Diagnosis:   Post-Op Diagnosis Codes:     * Other hydrocele [N43.2]    Anesthesia:   General    Estimated Blood Loss:    5 mL    Implants:   * No implants in log *    Drains:   Drains: no    Specimens:     ID Type Source Tests Collected by Time Destination   A : left hydrocele sac Not Applicable Hydrocele sac SURGICAL PATHOLOGY Murray Hodgkins, MD 11/08/2020 1103          Findings:   Large L hydrocele    Complications:   none      Signed by: Murray Hodgkins, MD                                                                           [facility]

## 2020-11-08 NOTE — Anesthesia Preprocedure Evaluation (Signed)
Anesthesia Evaluation    AIRWAY    Mallampati: II         CARDIOVASCULAR    regular       DENTAL    no notable dental hx     PULMONARY    clear to auscultation     OTHER FINDINGS                  Relevant Problems   NEURO/PSYCH   (+) History of sarcoidosis      CARDIO   (+) Essential hypertension, benign      GI   (+) GERD (gastroesophageal reflux disease)               Anesthesia Plan    ASA 3     general                     intravenous induction   Detailed anesthesia plan: general LMA        Post op pain management: per surgeon    informed consent obtained    ECG reviewed  pertinent labs reviewed             Signed by: Jolyne Loa, MD 11/08/20 9:21 AM

## 2020-11-08 NOTE — Progress Notes (Signed)
Patients weight listed wrong previously. Weight today 248 lbs

## 2020-11-08 NOTE — Transfer of Care (Signed)
Anesthesia Transfer of Care Note    Patient: Jerry Hickman    Procedures performed: Procedure(s):  (LEFT) HYDROCELECTOMY    Anesthesia type: General LMA    Patient location:Phase I PACU    Last vitals:   Vitals:    11/08/20 1200   BP: 154/89   Pulse: (!) 59   Resp: 12   Temp: 36.4 C (97.5 F)   SpO2: 94%       Post pain: Patient not complaining of pain, continue current therapy      Mental Status:awake and alert     Respiratory Function: tolerating room air    Cardiovascular: stable    Nausea/Vomiting: patient not complaining of nausea or vomiting    Hydration Status: adequate    Post assessment: no apparent anesthetic complications and no reportable events    Signed by: Louie Casa, CRNA  11/08/20 12:13 PM

## 2020-11-08 NOTE — Anesthesia Postprocedure Evaluation (Signed)
Anesthesia Post Evaluation    Patient: Jerry Hickman    Procedure(s):  (LEFT) HYDROCELECTOMY    Anesthesia type: general    Last Vitals:   Vitals Value Taken Time   BP 134/72 11/08/20 1220   Temp 36.4 C (97.5 F) 11/08/20 1200   Pulse 60 11/08/20 1220   Resp 21 11/08/20 1220   SpO2 95 % 11/08/20 1220                 Anesthesia Post Evaluation:     Patient Evaluated: PACU    Level of Consciousness: awake and alert    Pain Management: adequate    Airway Patency: patent    Anesthetic complications: No      PONV Status: none    Cardiovascular status: acceptable  Respiratory status: acceptable  Hydration status: acceptable        Signed by: Jolyne Loa, MD, 11/08/2020 4:15 PM

## 2020-11-08 NOTE — Discharge Instr - AVS First Page (Addendum)
Reason for your Hospital Admission:  ***      Instructions for after your discharge:  ***    Post Anesthesia Discharge Instructions    Although you may be awake and alert in the recovery room, small amounts of anesthetic remain in your system for about 24 hours.  You may feel tired and sleepy during this time.      You are advised to go directly home from the hospital.    Plan to stay at home and rest for the remainder of the day.    It is advisable to have someone with you at home for 24 hours after surgery.    Do not operate a motor vehicle, or any mechanical or electrical equipment for the next 24 hours.      Be careful when you are walking around, you may become dizzy.  The effects of anesthesia and/or medications are still present and drowsiness may occur    Do not consume alcohol, tranquilizers, sleeping medications, or any other non prescribed medication for the remainder of the day.    Diet:  begin with liquids, progress your diet as tolerated or as directed by your surgeon.  Nausea and vomiting may occur in the next 24 hours.

## 2020-11-08 NOTE — Op Note (Signed)
Procedure Date: 11/08/2020     Patient Type: A     SURGEON: Murray Hodgkins MD  ASSISTANT:       ASSISTANT SURGEON:    Kevan Rosebush, MD       PREOPERATIVE DIAGNOSIS:  Left hydrocele.     POSTOPERATIVE DIAGNOSIS:  Left hydrocele.     TITLE OF PROCEDURE:  Left hydrocelectomy.     ANESTHESIA:  General.     INTRAVENOUS FLUIDS:  Crystalloid.     ESTIMATED BLOOD LOSS:  Minimal.     URINE OUTPUT:  Not measured.     DRAINS:  None.     SPECIMENS:  Left hydrocele sac for permanent pathology.     COMPLICATIONS:  None.     INDICATIONS FOR PROCEDURE:  Jerry Hickman is a very pleasant 65 year old gentleman who began to develop  left-sided scrotal swelling and was evaluated with ultrasonography  confirming the presence of a moderate-sized left hydrocele.  This continued  to enlarge and become more tense and more uncomfortable and we discussed  treatment options including hydrocelectomy.  We reviewed the procedure, the  risks and the alternatives and he was agreeable to proceed and presents for  the procedure today.     DESCRIPTION OF PROCEDURE:  The patient was met in the preoperative area.  All of his questions were  answered and informed consent was obtained.  He was marked on his left side  for this left-sided procedure.  He was brought back to the operating room,  placed on the operating table in supine position.  Sequential compression  devices were applied to his lower extremities bilaterally.  Appropriate  intravenous antibiotic prophylaxis was administered.  After the induction  of anesthesia, his groin was prepped and draped in the usual sterile  manner.  A surgical timeout was performed correctly identifying the  patient, procedure, and the laterality.  With the entire team in agreement,  the procedure was started.  We started by making a transverse incision  following the skin lines of the left hemiscrotum and dissected sharply  through the skin.  I then used Bovie electrocautery to dissect down through  dartos fascia.   The hydrocele surface was encountered and blunt dissection  was used to separate this out from the fascial attachments.  The hydrocele  was able to be delivered out through the scrotal incision and then a #15  scalpel was used to incise the hydrocele immediately yielding copious  straw-colored clear fluid.  The hydrocele drained approximately 400 to 500  mL of straw-colored urine and then the hydrocele sac was further opened and  the testis was delivered onto the field.  The testis had a normal  appearance.  There were no palpable or visible masses or abnormalities.   The appendix testis and appendix epididymis were removed.  The excess  hydrocele sac was excised with Bovie electrocautery and this was sent as a  specimen.  The hydrocele cavity was evaluated fully and there was no  evidence of a hernia communication.  The hydrocele sac was then inverted.   The edges of the sac were cauterized for excellent hemostasis.  Running 3-0  Vicryl was used to reapproximate the edges of the inverted hydrocele sac.   The testis was then oriented in normal anatomic position with lateral  sulcus of the epididymis facing laterally.  The cord was confirmed to not  be twisted in any way.  The testis remained pink, warm and viable.  The  testis was returned  to the left hemiscrotum.  The scrotal skin was inverted  and pinpoint electrocautery was used for excellent hemostasis.  Irrigation  was performed with saline and at this point, we proceeded to close the  incision, this was done in 2 layers, 3-0 running Vicryl was used to close  dartos and horizontal mattress with 3-0 chromic was used to reapproximate  the skin.  Local anesthesia in the form of 0.25% Marcaine and 1% lidocaine  plain was injected around the incision for local anesthesia and a dressing  was applied consisting of bacitracin, Telfa, fluffs and a scrotal  supporter, and at this point, the procedure was concluded.  He tolerated it  well.  He was awoken from anesthesia  and brought back to the recovery room  in stable condition.  All instrument counts were correct at the end of the  case.           D:  11/08/2020 14:10 PM by Dr. Luevenia Maxin. Andrey Campanile, MD ([id number])  T:  11/08/2020 14:20 PM by NTS      Everlean Cherry: [id number]) (Doc ID: [id number])

## 2020-11-08 NOTE — H&P (Signed)
ADMISSION HISTORY AND PHYSICAL EXAM    Date Time: 11/08/20 9:25 AM  Patient Name: Jerry Hickman  Attending Physician: Murray Hodgkins, MD    Assessment:   [phone] with left hydrocele    Plan:   To OR for L hydrocelectomy    History of Present Illness:   Jerry Hickman is a 65 y.o. male who presents to the hospital with L scrotal swelling, and enlarging uncomfortable hydrocele. Presents for treatment.     Past Medical History:     Past Medical History:   Diagnosis Date    Abnormal vision     reading    Arthritis     L hip    Avascular necrosis     Chronic hip pain     Depression screening negative 12-01-12    Neg    Difficulty in walking(719.7)     limp .Marland KitchenMarland Kitchenpending surgery    GERD (gastroesophageal reflux disease)     on med    H/O splenomegaly     Denies this condition    Headache following lumbar puncture 2004    s/p spinal tap .... blood patch needed    Hyperlipidemia     on med    Hypertensive disorder     on med    Sarcoid     treated with steroids            Past Surgical History:     Past Surgical History:   Procedure Laterality Date    ARTHROPLASTY, HIP, TOTAL, ANTERIOR APPROACH, C ARM  03/10/2013    Procedure: ARTHROPLASTY, HIP, TOTAL, ANTERIOR APPROACH, C ARM;  Surgeon: Cynda Familia, MD;  Location: [facility];  Service: Orthopedics;  Laterality: Left;  LEFT HIP THA ANT      COLONOSCOPY  04/08/2012    JOINT REPLACEMENT      hip and knee    REPLACEMENT TOTAL KNEE  01/18/2011    right    ROTATOR CUFF REPAIR Left     TOTAL HIP ARTHROPLASTY  11/06/2004    Right        Family History:     Family History   Problem Relation Age of Onset    Cancer Mother         ovarian    Heart attack Father     Heart disease Father     Hyperlipidemia Father     Hypertension Father     Cancer Sister         breast cancer       Social History:     Social History     Socioeconomic History    Marital status: Divorced     Spouse name: Not on file    Number of children: Not on file    Years of education: Not on file     Highest education level: Not on file   Occupational History    Not on file   Tobacco Use    Smoking status: Never    Smokeless tobacco: Never   Vaping Use    Vaping Use: Never used   Substance and Sexual Activity    Alcohol use: Yes     Alcohol/week: 3.3 standard drinks     Types: 3 Glasses of wine per week    Drug use: No    Sexual activity: Not on file   Other Topics Concern    Not on file   Social History Narrative    The patient is to [place]  and has been since 1990, with 2 sons and one girl.  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     Social Determinants of Health     Financial Resource Strain: Not on file   Food Insecurity: Not on file   Transportation Needs: Not on file   Physical Activity: Not on file   Stress: Not on file   Social Connections: Not on file   Intimate Partner Violence: Not on file   Housing Stability: Not on file       Allergies:   No Known Allergies    Medications:     Medications Prior to Admission   Medication Sig    atorvastatin (LIPITOR) 20 MG tablet Take 1 tablet (20 mg total) by mouth daily    pantoprazole (PROTONIX) 20 MG tablet TAKE 1 TABLET BY MOUTH EVERY DAY    sildenafil (VIAGRA) 50 MG tablet TAKE 1 TABLET BY MOUTH AS NEEDED FOR ERECTILE DYSFUNCTION    valsartan (DIOVAN) 80 MG tablet Take 1 tablet (80 mg total) by mouth daily       Review of Systems:   History obtained from the patient  General ROS: negative for - chills, fatigue, fever or weight loss  Psychological ROS: negative for - depression, disorientation or memory difficulties  Ophthalmic ROS: negative for - blurry vision or loss of vision  Hematological and Lymphatic ROS: negative for - bleeding problems, night sweats or swollen lymph nodes  Endocrine ROS: negative for - malaise/lethargy, polydipsia/polyuria or skin changes  Respiratory ROS: no cough, shortness of breath, or wheezing  Cardiovascular ROS: no chest pain or dyspnea on exertion  Gastrointestinal ROS: no abdominal pain, change  in bowel habits, or black or bloody stools  Genito-Urinary ROS: no dysuria, trouble voiding, or hematuria  Musculoskeletal ROS: negative for - joint pain or muscle pain  Neurological ROS: negative for - headaches, impaired coordination/balance or numbness/tingling      Physical Exam:     Vitals:    11/08/20 0905   BP: 159/84   Pulse: 61   Temp: 97.4 F (36.3 C)   SpO2: 96%     General appearance - alert, well appearing, and in no distress  Mental status - alert, oriented to person, place, and time    Chest - Normal chest wall excursion; no wheezes, rales or rhonchi, symmetric air entry  Heart - normal rate and regular rhythm  Abdomen - soft, nontender, nondistended, no masses or organomegaly  Musculoskeletal - no joint tenderness, deformity or swelling  Extremities - peripheral pulses normal, no pedal edema, no clubbing or cyanosis      Labs:     Results             I have reviewed all lab results which are normal or stable.    Rads:   Relevant Radiological Procedure(s) reviewed.    Signed by: Festus Barren, MD

## 2020-11-09 ENCOUNTER — Ambulatory Visit (INDEPENDENT_AMBULATORY_CARE_PROVIDER_SITE_OTHER): Payer: No Typology Code available for payment source | Admitting: Urology

## 2020-11-09 ENCOUNTER — Encounter: Payer: Self-pay | Admitting: Urology

## 2020-11-10 LAB — LAB USE ONLY - HISTORICAL SURGICAL PATHOLOGY

## 2020-11-13 ENCOUNTER — Encounter (INDEPENDENT_AMBULATORY_CARE_PROVIDER_SITE_OTHER): Payer: Self-pay

## 2020-11-14 ENCOUNTER — Encounter (INDEPENDENT_AMBULATORY_CARE_PROVIDER_SITE_OTHER): Payer: Self-pay

## 2020-11-20 ENCOUNTER — Encounter (INDEPENDENT_AMBULATORY_CARE_PROVIDER_SITE_OTHER): Payer: Self-pay

## 2020-11-20 ENCOUNTER — Ambulatory Visit (INDEPENDENT_AMBULATORY_CARE_PROVIDER_SITE_OTHER): Payer: No Typology Code available for payment source | Admitting: Urology

## 2020-11-20 VITALS — BP 162/94 | HR 62

## 2020-11-20 DIAGNOSIS — N432 Other hydrocele: Secondary | ICD-10-CM

## 2020-11-20 LAB — URINALYSIS POC
Blood, UA POCT: NEGATIVE
POCT Urine Bilirubin: NEGATIVE
POCT Urine Glucose: NEGATIVE mg/dL
POCT Urine Ketones: NEGATIVE mg/dL
POCT Urine Nitrites: NEGATIVE
POCT Urine Urobilibogen: 0.2 mg/dL (ref 0.0–1.0)
POCT Urine pH: 7 (ref 5.0–8.0)
Protein, UR POCT: NEGATIVE mg/dL
Urine Specific Gravity POC: 1.02 (ref 1.001–1.035)
Urine leukocyte Esterase, POCT: NEGATIVE

## 2020-11-20 NOTE — Progress Notes (Signed)
Jerry Hickman, a 65 yo male had a L hydrocelectomy by Dr Andrey Campanile  on 11/08/20, he is here today for a post operative visit and wound check.    Patient reports that he is feeling well--Denies N/V/F/C, abdominal or pelvic pain, or other symptom of systemic infection.  Reports mimimal pain at the incision site that is managed well.  Patient's appetite is returning to normal, he's eating and drinking well, and is having regular bowel movements.  Incision is healing well, approximated with sutures, no reddness or other SS of infection.      Surgical pathology review with patient.      Contact doctor if N/V/F/C, worsening gross hematuria, dysuria, new pain or other concern for infection.    Do daily walking for exercise.      Care of this patient reviewed by Dr Andrey Campanile, the supervising MD.

## 2020-11-20 NOTE — Patient Instructions (Signed)
Follow up with Dr Wilson in 3 months

## 2020-12-04 ENCOUNTER — Ambulatory Visit (INDEPENDENT_AMBULATORY_CARE_PROVIDER_SITE_OTHER): Payer: No Typology Code available for payment source | Admitting: Urology

## 2020-12-08 ENCOUNTER — Encounter (INDEPENDENT_AMBULATORY_CARE_PROVIDER_SITE_OTHER): Payer: No Typology Code available for payment source | Admitting: Family Medicine

## 2020-12-14 ENCOUNTER — Encounter (INDEPENDENT_AMBULATORY_CARE_PROVIDER_SITE_OTHER): Payer: Self-pay

## 2020-12-25 ENCOUNTER — Other Ambulatory Visit (INDEPENDENT_AMBULATORY_CARE_PROVIDER_SITE_OTHER): Payer: Self-pay | Admitting: Family Medicine

## 2021-01-13 ENCOUNTER — Encounter (INDEPENDENT_AMBULATORY_CARE_PROVIDER_SITE_OTHER): Payer: Self-pay

## 2021-01-14 ENCOUNTER — Encounter (INDEPENDENT_AMBULATORY_CARE_PROVIDER_SITE_OTHER): Payer: Self-pay

## 2021-02-12 ENCOUNTER — Telehealth: Payer: Self-pay

## 2021-02-12 NOTE — Telephone Encounter (Signed)
[facility] Coordinator reached out to patient to see if we could be of assistance in helping them manage their health care needs. Patient is doing fine. Patient declined any assistance at this time. Informed patient about the [employer] 24 hr Health Line.    Heddy Vidana F., Coordinator  AMB [facility] - [facility]

## 2021-02-13 ENCOUNTER — Encounter (INDEPENDENT_AMBULATORY_CARE_PROVIDER_SITE_OTHER): Payer: Self-pay

## 2021-02-14 ENCOUNTER — Encounter (INDEPENDENT_AMBULATORY_CARE_PROVIDER_SITE_OTHER): Payer: Self-pay

## 2021-02-14 ENCOUNTER — Telehealth (INDEPENDENT_AMBULATORY_CARE_PROVIDER_SITE_OTHER): Payer: Self-pay

## 2021-02-14 NOTE — Telephone Encounter (Signed)
02/14/2021  11:20am:Called patient to schedule appointment with one of our providers. Patient did not answer. LVM informing patient to call back when ready to schedule. RH

## 2021-02-20 ENCOUNTER — Ambulatory Visit (INDEPENDENT_AMBULATORY_CARE_PROVIDER_SITE_OTHER): Payer: No Typology Code available for payment source | Admitting: Urology

## 2021-03-15 ENCOUNTER — Encounter (INDEPENDENT_AMBULATORY_CARE_PROVIDER_SITE_OTHER): Payer: Self-pay

## 2021-03-16 ENCOUNTER — Encounter (INDEPENDENT_AMBULATORY_CARE_PROVIDER_SITE_OTHER): Payer: Self-pay

## 2021-04-15 ENCOUNTER — Encounter (INDEPENDENT_AMBULATORY_CARE_PROVIDER_SITE_OTHER): Payer: Self-pay

## 2021-04-16 ENCOUNTER — Encounter (INDEPENDENT_AMBULATORY_CARE_PROVIDER_SITE_OTHER): Payer: Self-pay

## 2021-05-16 ENCOUNTER — Encounter (INDEPENDENT_AMBULATORY_CARE_PROVIDER_SITE_OTHER): Payer: Self-pay

## 2021-05-17 ENCOUNTER — Encounter (INDEPENDENT_AMBULATORY_CARE_PROVIDER_SITE_OTHER): Payer: Self-pay

## 2021-06-13 ENCOUNTER — Encounter (INDEPENDENT_AMBULATORY_CARE_PROVIDER_SITE_OTHER): Payer: Self-pay

## 2021-06-21 ENCOUNTER — Encounter (INDEPENDENT_AMBULATORY_CARE_PROVIDER_SITE_OTHER): Payer: Self-pay

## 2021-07-02 ENCOUNTER — Other Ambulatory Visit (INDEPENDENT_AMBULATORY_CARE_PROVIDER_SITE_OTHER): Payer: Self-pay | Admitting: Family Medicine

## 2021-07-02 DIAGNOSIS — E782 Mixed hyperlipidemia: Secondary | ICD-10-CM

## 2021-07-02 NOTE — Telephone Encounter (Signed)
Let him know he is overdue to be seen.   He will need to make the appointment with one of the other providers.   He will need to be seen and get labs prior to further refills.

## 2021-07-05 ENCOUNTER — Encounter (INDEPENDENT_AMBULATORY_CARE_PROVIDER_SITE_OTHER): Payer: No Typology Code available for payment source | Admitting: Family Medicine

## 2021-07-14 ENCOUNTER — Encounter (INDEPENDENT_AMBULATORY_CARE_PROVIDER_SITE_OTHER): Payer: Self-pay

## 2021-07-15 ENCOUNTER — Encounter (INDEPENDENT_AMBULATORY_CARE_PROVIDER_SITE_OTHER): Payer: Self-pay

## 2021-07-17 ENCOUNTER — Encounter (INDEPENDENT_AMBULATORY_CARE_PROVIDER_SITE_OTHER): Payer: Self-pay | Admitting: Family Medicine

## 2021-07-17 ENCOUNTER — Ambulatory Visit (INDEPENDENT_AMBULATORY_CARE_PROVIDER_SITE_OTHER): Payer: No Typology Code available for payment source | Admitting: Family Medicine

## 2021-07-17 VITALS — BP 159/93 | HR 66 | Temp 98.1°F | Wt 252.4 lb

## 2021-07-17 DIAGNOSIS — E782 Mixed hyperlipidemia: Secondary | ICD-10-CM

## 2021-07-17 DIAGNOSIS — R748 Abnormal levels of other serum enzymes: Secondary | ICD-10-CM

## 2021-07-17 DIAGNOSIS — K219 Gastro-esophageal reflux disease without esophagitis: Secondary | ICD-10-CM

## 2021-07-17 DIAGNOSIS — E669 Obesity, unspecified: Secondary | ICD-10-CM

## 2021-07-17 DIAGNOSIS — Z23 Encounter for immunization: Secondary | ICD-10-CM

## 2021-07-17 DIAGNOSIS — N528 Other male erectile dysfunction: Secondary | ICD-10-CM

## 2021-07-17 DIAGNOSIS — I10 Essential (primary) hypertension: Secondary | ICD-10-CM

## 2021-07-17 DIAGNOSIS — Z1211 Encounter for screening for malignant neoplasm of colon: Secondary | ICD-10-CM

## 2021-07-17 LAB — LIPID PANEL
Cholesterol / HDL Ratio: 3.2 Index
Cholesterol: 215 mg/dL — ABNORMAL HIGH (ref 0–199)
HDL: 68 mg/dL (ref 40–9999)
LDL Calculated: 115 mg/dL — ABNORMAL HIGH (ref 0–99)
Triglycerides: 158 mg/dL — ABNORMAL HIGH (ref 34–149)
VLDL Calculated: 32 mg/dL (ref 10–40)

## 2021-07-17 LAB — COMPREHENSIVE METABOLIC PANEL
ALT: 87 U/L — ABNORMAL HIGH (ref 0–55)
AST (SGOT): 48 U/L — ABNORMAL HIGH (ref 5–41)
Albumin/Globulin Ratio: 1.4 (ref 0.9–2.2)
Albumin: 4 g/dL (ref 3.5–5.0)
Alkaline Phosphatase: 61 U/L (ref 37–117)
Anion Gap: 8 (ref 5.0–15.0)
BUN: 21 mg/dL (ref 9.0–28.0)
Bilirubin, Total: 0.7 mg/dL (ref 0.2–1.2)
CO2: 27 mEq/L (ref 17–29)
Calcium: 9.6 mg/dL (ref 8.5–10.5)
Chloride: 105 mEq/L (ref 99–111)
Creatinine: 1.1 mg/dL (ref 0.5–1.5)
Globulin: 2.9 g/dL (ref 2.0–3.6)
Glucose: 116 mg/dL — ABNORMAL HIGH (ref 70–100)
Potassium: 4.8 mEq/L (ref 3.5–5.3)
Protein, Total: 6.9 g/dL (ref 6.0–8.3)
Sodium: 140 mEq/L (ref 135–145)

## 2021-07-17 LAB — MICROALBUMIN, RANDOM URINE
Urine Creatinine, Random: 97.6 mg/dL
Urine Microalbumin, Random: <5 ug/ml (ref 0.0–30.0)

## 2021-07-17 LAB — GFR: EGFR: >60

## 2021-07-17 LAB — HEMOLYSIS INDEX: Hemolysis Index: 8 Index (ref 0–24)

## 2021-07-17 MED ORDER — VALSARTAN 160 MG PO TABS
160.0000 mg | ORAL_TABLET | Freq: Every day | ORAL | 1 refills | Status: DC
Start: 2021-07-17 — End: 2022-01-01

## 2021-07-17 NOTE — Progress Notes (Signed)
Patient presented to the office for SHINGLES vaccine administration.  Received injection in the Right arm.  No reaction was noted and patient left in good condition.   Have you seen any specialists/other providers since your last visit with [place]?    No      The patient was informed that the following HM items are still outstanding:   Health Maintenance Due   Topic Date Due    Advance Directive on File  Never done    Shingrix Vaccine 50+ (1) Never done    Annual Exam  05/10/2014    Colorectal Cancer Screening  03/01/2016    COVID-19 Vaccine (3 - Booster for [employer] series) 09/29/2020

## 2021-07-17 NOTE — Progress Notes (Signed)
Subjective:      Patient ID: Jerry Hickman  is a 66 y.o.  male.     THOMPSON MCKIM is a 66 y.o. male presenting for   Chief Complaint   Patient presents with    Medication Refill       HPI    BEARL TALARICO is here to establish care and to follow-up on chronic multiple medical issues.    Previous PCP has retired recently.    Hypertension, blood pressure elevated range today.  Patient reports he does monitor BP at home intermittently and blood pressure usually runs in between 140-148/80-85's.  He does take valsartan 80 mg and tolerating well.  Denies any associated symptoms and no evidence of CHF.    Hyperlipidemia, taking atorvastatin 20 mg several years.  Tolerating well.  Lab done 08/2020 reviewed.  He does go to gym 5 days a week but he does not watch his diet much.    GERD, doing well on pantoprazole intake.  He does take pantoprazole every other day with concern of side effect of interstitial nephritis as suggested by previous PCP.  Currently doing well.    Obesity, BMI more than 37.  He does go to gym 5 days/week (does weightlifting and cardio) but does not watch his diet much specially he does drink several cans of beer on weekend.    Elevated LFTs, persistent elevated LFTs noted on previous lab.  Denies any associated symptoms.  He does do exercise regular basis but he does drink several cans of beer on weekend.    Colonoscopy, last colonoscopy as scanned in EMR was 01/2013 noted to have polyps.  Per patient he repeat colonoscopy 2020 (no report available in EMR system) and patient reports he was told to have precancerous/benign polyp and to repeat colonoscopy in 3 to 5 years.  PCP already referred him for colonoscopy.  Patient would like to contact same gastroenterologist office to find out when he is due for next colonoscopy and will schedule colonoscopy accordingly.  Currently denies any GI related symptoms.  Denies any family history of colon cancer or GI related cancer.    immunization, Tdap was  given 08/2019.  Patient reports he has received COVID-vaccine with booster dose x1.  Declined flu vaccine.  Shingles vaccine first dose given today.    The following portions of the patient's history were reviewed and updated as appropriate: current medications, allergies, past family history, past medical history, past social history, past surgical history and problem list.     Review of Systems   Constitutional:  Negative for activity change and fatigue.   HENT:  Negative for congestion, tinnitus, trouble swallowing and voice change.    Eyes:  Negative for photophobia and visual disturbance.   Respiratory:  Negative for chest tightness and shortness of breath.    Cardiovascular:  Negative for chest pain, palpitations and leg swelling.   Gastrointestinal:  Negative for abdominal pain, blood in stool, constipation, diarrhea, nausea and vomiting.        Positive for GERD   Genitourinary:  Negative for difficulty urinating, frequency, hematuria, penile discharge, penile pain, penile swelling, scrotal swelling, testicular pain and urgency.        Intermittent erectile dysfunction   Musculoskeletal:  Negative for arthralgias, back pain, gait problem, myalgias and neck pain.   Skin:  Negative for rash.   Neurological:  Negative for dizziness, syncope, weakness, light-headedness, numbness and headaches.   Hematological:  Negative for adenopathy.   Psychiatric/Behavioral:  Negative for behavioral problems.           BP (!) 159/93 (BP Site: Left arm, Patient Position: Sitting, Cuff Size: Large)   Pulse 66   Temp 98.1 F (36.7 C) (Temporal)   Wt 114.5 kg (252 lb 6.4 oz)   SpO2 96%   BMI 37.26 kg/m     Objective:     Physical Exam  Constitutional:       General: He is not in acute distress.  HENT:      Head: Normocephalic.      Nose: Nose normal.   Eyes:      General: No scleral icterus.     Extraocular Movements: Extraocular movements intact.      Conjunctiva/sclera: Conjunctivae normal.      Pupils: Pupils are equal,  round, and reactive to light.   Neck:      Thyroid: No thyromegaly.   Cardiovascular:      Rate and Rhythm: Normal rate and regular rhythm.      Heart sounds: Normal heart sounds.   Pulmonary:      Effort: Pulmonary effort is normal. No respiratory distress.      Breath sounds: Normal breath sounds. No wheezing.   Musculoskeletal:         General: Normal range of motion.      Cervical back: Normal range of motion and neck supple.      Right lower leg: No edema.      Left lower leg: No edema.   Lymphadenopathy:      Cervical: No cervical adenopathy.   Skin:     General: Skin is warm.      Comments: Skin turgor normal   Neurological:      General: No focal deficit present.      Mental Status: He is alert and oriented to person, place, and time.      Deep Tendon Reflexes: Reflexes are normal and symmetric.   Psychiatric:         Mood and Affect: Mood normal.         Behavior: Behavior normal.         Thought Content: Thought content normal.         Judgment: Judgment normal.           Assessment/Plan:     Establish care today.    Hypertension, blood pressure elevated and blood pressure also running at suboptimal range at home  -After a lengthy discussion patient agreed to increase dosage of valsartan from 80 to 160 mg daily.  Patient has been tolerating valsartan well.  -Advised to continue monitoring BP at home.  If persistently elevated blood pressure more than 150/90 is to call back.  Patient agreeable with the plan.  -Reviewed low-salt diet and exercise daily basis.  -Reviewed signs symptom of elevated blood pressure in detail that necessitate urgent/ED evaluation.  Patient verbally understands  - lab today  -Patient will contact via MyChart message in 2 weeks to follow-up on home BP readings.  -Patient scheduled for CPE on 08/21/2021.    Hyperlipidemia, tolerating statin well  -Reviewed low-cholesterol diet and encouraged exercise regular basis  -Lab today.    GERD, doing well on PPI intake  -Reviewed long-term  side effect of PPI in detail  -Reviewed lifestyle modification and encouraged weight loss.    Obesity, BMI more than 37  -Reviewed low-calorie healthy diet and advised to cut down beer as much as possible.  Patient agreed to work.  -  Encouraged to continue exercise.  -Lab today    Elevated LFT's, persistent elevated LFTs noted.  -Recommended to cut down alcohol consumption  -Lab today.    Colonoscopy, last colonoscopy as scanned in EMR was 01/2013 noted to have polyps.  Per patient he repeat colonoscopy 2020 (no report available in EMR system) and patient reports he was told to have precancerous/benign polyp and to repeat colonoscopy in 3 to 5 years.  PCP already referred him for colonoscopy.  Patient would like to contact same gastroenterologist office to find out when he is due for next colonoscopy and will schedule colonoscopy accordingly.  Currently denies any GI related symptoms.  Denies any family history of colon cancer or GI related cancer.    Call if you develop any new or worsening symptoms  Proceed to urgent care or ED after hours if necessary    Huntley Dec, MD        RTC as scheduled 08/21/2021 for CPE

## 2021-07-23 ENCOUNTER — Other Ambulatory Visit (INDEPENDENT_AMBULATORY_CARE_PROVIDER_SITE_OTHER): Payer: Self-pay | Admitting: Family Medicine

## 2021-07-23 DIAGNOSIS — R748 Abnormal levels of other serum enzymes: Secondary | ICD-10-CM

## 2021-07-23 NOTE — Progress Notes (Signed)
 Please inform patient following (please call patient:)    -Liver function test persistent elevated.  Possible cause could be taking statin versus fatty liver.  He had liver ultrasound done 12/2018 which showed hepatic steatosis (fatty liver).  I am recommending further blood work including acute hepatitis panel to rule out any underlying liver infection.  -You can improve liver function test by losing weight with low-calorie, low-cholesterol healthy diet and exercise.  Also can improve liver function test by avoiding excessive alcohol consumption.  -Kidney function and all other electrolytes within normal range.  -Fasting sugar is elevated to prediabetic range.  Recommending to try low carbohydrate diet, try to avoid simple/concentrated sugar and try exercise regular basis.  -Cholesterol borderline elevated range despite of taking atorvastatin .  Recommending to try low-cholesterol diet and exercise along with cholesterol medication.  -Urine protein test unremarkable.

## 2021-07-27 ENCOUNTER — Other Ambulatory Visit: Payer: No Typology Code available for payment source

## 2021-08-03 ENCOUNTER — Telehealth (INDEPENDENT_AMBULATORY_CARE_PROVIDER_SITE_OTHER): Payer: No Typology Code available for payment source | Admitting: Family Medicine

## 2021-08-03 DIAGNOSIS — J069 Acute upper respiratory infection, unspecified: Secondary | ICD-10-CM

## 2021-08-03 DIAGNOSIS — R748 Abnormal levels of other serum enzymes: Secondary | ICD-10-CM

## 2021-08-03 DIAGNOSIS — I10 Essential (primary) hypertension: Secondary | ICD-10-CM

## 2021-08-03 DIAGNOSIS — E782 Mixed hyperlipidemia: Secondary | ICD-10-CM

## 2021-08-03 MED ORDER — BENZONATATE 100 MG PO CAPS
100.0000 mg | ORAL_CAPSULE | Freq: Three times a day (TID) | ORAL | 0 refills | Status: AC | PRN
Start: 2021-08-03 — End: 2021-11-01

## 2021-08-03 MED ORDER — AZITHROMYCIN 250 MG PO TABS
ORAL_TABLET | ORAL | 0 refills | Status: AC
Start: 2021-08-03 — End: 2021-08-09

## 2021-08-03 NOTE — Progress Notes (Signed)
Subjective:      Patient ID: Jerry Hickman  is a 66 y.o.  male.     Jerry Hickman is a 66 y.o. male presenting for   Chief Complaint   Patient presents with    URI     Cough, congestion  > 10 days       HPI    Verbal consent has been obtained from the patient to conduct a video and telephone visit to minimize exposure to COVID-19: yes    Jerry Hickman is on telemedicine visit to discuss following concern.    Patient reports he has been experiencing cough, congestion > 10 days.  He reports cough is productive with yellowish/greenish sputum.  He denies short of breath or chest tightness, wheezing.  No chest pain.  No dyspnea on exertion.  No fever or chills.  He does have mild nasal congestion but no sinus pain or pressure.  No sore throat.  No earache.  Denies any GI related symptoms.  No history of asthma.    Recent lab reviewed with patient.    Hypertension, recently blood pressure medication valsartan was increased from 80 to 160 mg daily.  Improved BP at home.  Tolerating medication well.  Denies any associated symptoms.    Elevated LFTs, lab done 07/2021 noted persistent elevated LFTs.He had liver ultrasound done 12/2018 which showed hepatic steatosis (fatty liver).  Recommended low-cholesterol diet, low-calorie healthy diet, exercise and advised to avoid excessive alcohol intake.  Patient agreeable to do acute hepatitis lab as recommended.  Denies any associated symptoms    Hyperlipidemia, lab done 07/2021 reviewed, LDL 115, TG 158, TC 115, HDL 68, LFTs elevated but stable range.  He has been taking statin and tolerating well.  He does try healthy diet as much possible.        The following portions of the patient's history were reviewed and updated as appropriate: current medications, allergies, past family history, past medical history, past social history, past surgical history and problem list.     Review of Systems   Constitutional:  Negative for activity change, appetite change, chills, fatigue,  fever and unexpected weight change.   HENT:  Positive for congestion, postnasal drip and rhinorrhea. Negative for ear pain, sinus pressure, sinus pain, sore throat, tinnitus, trouble swallowing and voice change.    Eyes:  Negative for photophobia, pain, discharge and visual disturbance.   Respiratory:  Positive for cough. Negative for chest tightness, shortness of breath and wheezing.    Cardiovascular:  Negative for chest pain, palpitations and leg swelling.   Gastrointestinal:  Negative for abdominal pain, diarrhea, nausea and vomiting.   Genitourinary:  Negative for difficulty urinating and dysuria.   Musculoskeletal:  Negative for arthralgias, gait problem, joint swelling, myalgias, neck pain and neck stiffness.   Skin:  Negative for rash.   Allergic/Immunologic: Negative for environmental allergies and food allergies.   Neurological:  Negative for dizziness, syncope, weakness, numbness and headaches.   Hematological:  Negative for adenopathy.   Psychiatric/Behavioral:  Negative for behavioral problems.           There were no vitals taken for this visit.    Objective:     Physical Exam   Constitutional-looks comfortable.  Well-nourished.  No acute distress noted.  Alert and oriented x3.  HEENT-conjunctival looks normal.  EOM-normal  Neck-no goiter noted  Respiratory-breathing comfortably.  No breathing difficulties noted.  No cough noted.  Psych-mood and affect normal.  Behavior normal.  Thought  content and judgment normal.  Good eye contact.       Assessment/Plan:     Cough, congestion > 10 days  -Patient concerned about acute bronchitis or pneumonia  -Prescribed antibiotic Z-Pak with given prolonged history of cough and congestion.  Reviewed use and potential side effect of Z-Pak in detail.  -Prescribed cough medicine Tessalon to be taken as needed.  -Reviewed rest, increase fluid intake.  -Reviewed nasal decongestant, nasal saline spray as needed, coolmist vaporizer/Nettie pot as needed, over-the-counter  antihistamine as needed  -Tylenol as needed for headache, fever or body ache.  -Reviewed signs symptoms in detail that necessitate urgent/ED evaluation.  Patient verbally understands.    Hypertension, on recent office visit blood pressure medication dosage was increased from valsartan 80 to 160 mg, tolerating well  -BP improved.  -Advised to continue monitoring BP at home.  If persistently elevated blood pressure more than 150/90 is to call back.  Patient agreeable with the plan.  -Reviewed low-salt diet and exercise daily basis.  -Reviewed signs symptom of elevated blood pressure in detail that necessitate urgent/ED evaluation.  Patient verbally understands  -Recent lab done reviewed    Elevated LFTs, persistent elevated LFTs  -Statin side effect versus fatty liver.  Ultrasound done 2020 noted  hepatic steatosis.  -Encouraged low calorie, low-cholesterol healthy diet, exercise and weight loss.  Advised to avoid excessive alcohol intake  -Patient agreeable to come back to our clinic for acute hepatitis lab.    Hyperlipidemia, cholesterol okay range on current dosage of statin  -Reviewed low-cholesterol diet and encouraged exercise.  -Labs done 07/2021 reviewed  -3 months.    Call if you develop any new or worsening symptoms  Proceed to urgent care or ED after hours if necessary    Winnona Wargo, MD        Return if symptoms worsen or fail to improve.

## 2021-08-13 ENCOUNTER — Encounter (INDEPENDENT_AMBULATORY_CARE_PROVIDER_SITE_OTHER): Payer: Self-pay

## 2021-08-14 ENCOUNTER — Encounter (INDEPENDENT_AMBULATORY_CARE_PROVIDER_SITE_OTHER): Payer: Self-pay

## 2021-08-21 ENCOUNTER — Encounter (INDEPENDENT_AMBULATORY_CARE_PROVIDER_SITE_OTHER): Payer: No Typology Code available for payment source | Admitting: Family Medicine

## 2021-09-05 ENCOUNTER — Telehealth: Payer: Self-pay

## 2021-09-05 NOTE — Telephone Encounter (Signed)
[facility] Coordinator reached out to patient to see if we could be of assistance in helping them manage their health care needs. Patient is doing very well. Patient declined any assistance at this time. Informed patient about the [employer] 24 hr Health Line.    Ovidio Kin., Coordinator  [facility]

## 2021-09-13 ENCOUNTER — Encounter (INDEPENDENT_AMBULATORY_CARE_PROVIDER_SITE_OTHER): Payer: Self-pay

## 2021-10-01 ENCOUNTER — Other Ambulatory Visit (INDEPENDENT_AMBULATORY_CARE_PROVIDER_SITE_OTHER): Payer: Self-pay | Admitting: Family Medicine

## 2021-10-01 DIAGNOSIS — E782 Mixed hyperlipidemia: Secondary | ICD-10-CM

## 2021-10-13 ENCOUNTER — Encounter (INDEPENDENT_AMBULATORY_CARE_PROVIDER_SITE_OTHER): Payer: Self-pay

## 2021-11-13 ENCOUNTER — Encounter (INDEPENDENT_AMBULATORY_CARE_PROVIDER_SITE_OTHER): Payer: Self-pay

## 2021-12-14 ENCOUNTER — Encounter (INDEPENDENT_AMBULATORY_CARE_PROVIDER_SITE_OTHER): Payer: Self-pay

## 2021-12-31 ENCOUNTER — Other Ambulatory Visit (INDEPENDENT_AMBULATORY_CARE_PROVIDER_SITE_OTHER): Payer: Self-pay | Admitting: Family Medicine

## 2021-12-31 DIAGNOSIS — K219 Gastro-esophageal reflux disease without esophagitis: Secondary | ICD-10-CM

## 2021-12-31 NOTE — Telephone Encounter (Signed)
LOV 07/17/21

## 2022-01-13 ENCOUNTER — Encounter (INDEPENDENT_AMBULATORY_CARE_PROVIDER_SITE_OTHER): Payer: Self-pay

## 2022-02-13 ENCOUNTER — Encounter (INDEPENDENT_AMBULATORY_CARE_PROVIDER_SITE_OTHER): Payer: Self-pay

## 2022-03-15 ENCOUNTER — Encounter (INDEPENDENT_AMBULATORY_CARE_PROVIDER_SITE_OTHER): Payer: Self-pay

## 2022-03-29 ENCOUNTER — Other Ambulatory Visit (INDEPENDENT_AMBULATORY_CARE_PROVIDER_SITE_OTHER): Payer: Self-pay | Admitting: Family Medicine

## 2022-03-29 DIAGNOSIS — E782 Mixed hyperlipidemia: Secondary | ICD-10-CM

## 2022-04-15 ENCOUNTER — Encounter (INDEPENDENT_AMBULATORY_CARE_PROVIDER_SITE_OTHER): Payer: Self-pay

## 2022-04-26 ENCOUNTER — Other Ambulatory Visit (INDEPENDENT_AMBULATORY_CARE_PROVIDER_SITE_OTHER): Payer: Self-pay

## 2022-04-26 ENCOUNTER — Encounter (INDEPENDENT_AMBULATORY_CARE_PROVIDER_SITE_OTHER): Payer: Self-pay

## 2022-04-29 ENCOUNTER — Other Ambulatory Visit (INDEPENDENT_AMBULATORY_CARE_PROVIDER_SITE_OTHER): Payer: Self-pay | Admitting: Family Medicine

## 2022-04-29 DIAGNOSIS — E782 Mixed hyperlipidemia: Secondary | ICD-10-CM

## 2022-04-29 DIAGNOSIS — K219 Gastro-esophageal reflux disease without esophagitis: Secondary | ICD-10-CM

## 2022-04-29 MED ORDER — VALSARTAN 160 MG PO TABS
160.0000 mg | ORAL_TABLET | Freq: Every day | ORAL | 0 refills | Status: DC
Start: 2022-04-29 — End: 2022-08-06

## 2022-04-29 MED ORDER — ATORVASTATIN CALCIUM 20 MG PO TABS
20.0000 mg | ORAL_TABLET | Freq: Every day | ORAL | 0 refills | Status: DC
Start: 2022-04-29 — End: 2022-07-15

## 2022-04-29 MED ORDER — PANTOPRAZOLE SODIUM 20 MG PO TBEC
20.0000 mg | DELAYED_RELEASE_TABLET | Freq: Every day | ORAL | 0 refills | Status: DC
Start: 2022-04-29 — End: 2022-08-06

## 2022-05-16 ENCOUNTER — Encounter (INDEPENDENT_AMBULATORY_CARE_PROVIDER_SITE_OTHER): Payer: Self-pay

## 2022-06-14 ENCOUNTER — Encounter (INDEPENDENT_AMBULATORY_CARE_PROVIDER_SITE_OTHER): Payer: Self-pay

## 2022-06-27 ENCOUNTER — Other Ambulatory Visit (INDEPENDENT_AMBULATORY_CARE_PROVIDER_SITE_OTHER): Payer: Self-pay | Admitting: Family Medicine

## 2022-06-27 DIAGNOSIS — K219 Gastro-esophageal reflux disease without esophagitis: Secondary | ICD-10-CM

## 2022-06-27 NOTE — Telephone Encounter (Signed)
Per previous encounter, patient has moved.   Patient needs appt to continue with refills.

## 2022-07-15 ENCOUNTER — Encounter (INDEPENDENT_AMBULATORY_CARE_PROVIDER_SITE_OTHER): Payer: Self-pay

## 2022-07-15 ENCOUNTER — Other Ambulatory Visit (INDEPENDENT_AMBULATORY_CARE_PROVIDER_SITE_OTHER): Payer: Self-pay | Admitting: Family Medicine

## 2022-07-15 DIAGNOSIS — E782 Mixed hyperlipidemia: Secondary | ICD-10-CM

## 2022-07-15 NOTE — Telephone Encounter (Signed)
Recommending office visit to continue refilling medication.  I will refill medication with 30 days supply.    Thanks

## 2022-08-06 ENCOUNTER — Encounter (INDEPENDENT_AMBULATORY_CARE_PROVIDER_SITE_OTHER): Payer: Self-pay | Admitting: Family Medicine

## 2022-08-06 ENCOUNTER — Encounter (INDEPENDENT_AMBULATORY_CARE_PROVIDER_SITE_OTHER): Payer: Self-pay

## 2022-08-06 ENCOUNTER — Ambulatory Visit (INDEPENDENT_AMBULATORY_CARE_PROVIDER_SITE_OTHER): Payer: No Typology Code available for payment source | Admitting: Family Medicine

## 2022-08-06 VITALS — BP 144/78 | HR 73 | Temp 97.6°F | Resp 18 | Wt 253.0 lb

## 2022-08-06 DIAGNOSIS — Z23 Encounter for immunization: Secondary | ICD-10-CM

## 2022-08-06 DIAGNOSIS — Z0000 Encounter for general adult medical examination without abnormal findings: Secondary | ICD-10-CM

## 2022-08-06 DIAGNOSIS — E669 Obesity, unspecified: Secondary | ICD-10-CM

## 2022-08-06 DIAGNOSIS — E782 Mixed hyperlipidemia: Secondary | ICD-10-CM

## 2022-08-06 DIAGNOSIS — R748 Abnormal levels of other serum enzymes: Secondary | ICD-10-CM

## 2022-08-06 DIAGNOSIS — I10 Essential (primary) hypertension: Secondary | ICD-10-CM

## 2022-08-06 DIAGNOSIS — K219 Gastro-esophageal reflux disease without esophagitis: Secondary | ICD-10-CM

## 2022-08-06 LAB — COMPREHENSIVE METABOLIC PANEL
ALT: 105 U/L — ABNORMAL HIGH (ref 0–55)
AST (SGOT): 62 U/L — ABNORMAL HIGH (ref 5–41)
Albumin/Globulin Ratio: 1.5 (ref 0.9–2.2)
Albumin: 4.2 g/dL (ref 3.5–5.0)
Alkaline Phosphatase: 67 U/L (ref 37–117)
Anion Gap: 11 (ref 5.0–15.0)
BUN: 17 mg/dL (ref 9.0–28.0)
Bilirubin, Total: 0.5 mg/dL (ref 0.2–1.2)
CO2: 24 mEq/L (ref 17–29)
Calcium: 10 mg/dL (ref 8.5–10.5)
Chloride: 105 mEq/L (ref 99–111)
Creatinine: 1.1 mg/dL (ref 0.5–1.5)
Globulin: 2.8 g/dL (ref 2.0–3.6)
Glucose: 137 mg/dL — ABNORMAL HIGH (ref 70–100)
Potassium: 4.6 mEq/L (ref 3.5–5.3)
Protein, Total: 7 g/dL (ref 6.0–8.3)
Sodium: 140 mEq/L (ref 135–145)
eGFR: >60 mL/min/{1.73_m2} (ref >=60)

## 2022-08-06 LAB — HEMOLYSIS INDEX(SOFT): Hemolysis Index: 5 Index (ref 0–24)

## 2022-08-06 LAB — CBC AND DIFFERENTIAL
Absolute NRBC: 0 10*3/uL (ref 0.00–0.00)
Basophils Absolute Automated: 0.01 10*3/uL (ref 0.00–0.08)
Basophils Automated: 0.1 %
Eosinophils Absolute Automated: 0.01 10*3/uL (ref 0.00–0.44)
Eosinophils Automated: 0.1 %
Hematocrit: 39.5 % (ref 37.6–49.6)
Hgb: 13.5 g/dL (ref 12.5–17.1)
Immature Granulocytes Absolute: 0.02 10*3/uL (ref 0.00–0.07)
Immature Granulocytes: 0.3 %
Instrument Absolute Neutrophil Count: 5.39 10*3/uL (ref 1.10–6.33)
Lymphocytes Absolute Automated: 1.27 10*3/uL (ref 0.42–3.22)
Lymphocytes Automated: 17.8 %
MCH: 32.5 pg (ref 25.1–33.5)
MCHC: 34.2 g/dL (ref 31.5–35.8)
MCV: 95 fL (ref 78.0–96.0)
MPV: 10.6 fL (ref 8.9–12.5)
Monocytes Absolute Automated: 0.43 10*3/uL (ref 0.21–0.85)
Monocytes: 6 %
Neutrophils Absolute: 5.39 10*3/uL (ref 1.10–6.33)
Neutrophils: 75.7 %
Nucleated RBC: 0 /100 WBC (ref 0.0–0.0)
Platelets: 173 10*3/uL (ref 142–346)
RBC: 4.16 10*6/uL — ABNORMAL LOW (ref 4.20–5.90)
RDW: 13 % (ref 11–15)
WBC: 7.13 10*3/uL (ref 3.10–9.50)

## 2022-08-06 LAB — LIPID PANEL
Cholesterol / HDL Ratio: 3.2 Index
Cholesterol: 227 mg/dL — ABNORMAL HIGH (ref 0–199)
HDL: 70 mg/dL (ref 40–9999)
LDL Calculated: 133 mg/dL — ABNORMAL HIGH (ref 0–99)
Triglycerides: 121 mg/dL (ref 34–149)
VLDL Calculated: 24 mg/dL (ref 10–40)

## 2022-08-06 LAB — URINALYSIS WITH REFLEX TO MICROSCOPIC EXAM IF INDICATED
Bilirubin, UA: NEGATIVE
Blood, UA: NEGATIVE
Glucose, UA: NEGATIVE
Leukocyte Esterase, UA: NEGATIVE
Nitrite, UA: NEGATIVE
Specific Gravity UA: 1.029 (ref 1.001–1.035)
Urine pH: 6 (ref 5.0–8.0)
Urobilinogen, UA: NORMAL mg/dL

## 2022-08-06 LAB — PSA TOTAL: Prostate Specific Antigen, Total: 0.853 ng/mL (ref 0.000–4.000)

## 2022-08-06 LAB — URINE MICROALBUMIN, RANDOM
Urine Creatinine, Random: 234.4 mg/dL
Urine Microalbumin, Random: 12.7 ug/ml (ref 0.0–30.0)
Urine Microalbumin/Creatinine Ratio: 5 ug/mg (ref 0–30)

## 2022-08-06 LAB — HEMOGLOBIN A1C
Average Estimated Glucose: 116.9 mg/dL
Hemoglobin A1C: 5.7 % — ABNORMAL HIGH (ref 4.6–5.6)

## 2022-08-06 LAB — HEPATITIS C ANTIBODY, TOTAL: Hepatitis C, AB: NONREACTIVE

## 2022-08-06 LAB — TSH: TSH: 1.84 u[IU]/mL (ref 0.35–4.94)

## 2022-08-06 MED ORDER — PANTOPRAZOLE SODIUM 20 MG PO TBEC
20.0000 mg | DELAYED_RELEASE_TABLET | Freq: Every day | ORAL | 1 refills | Status: DC
Start: 2022-08-06 — End: 2023-02-07

## 2022-08-06 MED ORDER — ATORVASTATIN CALCIUM 20 MG PO TABS
20.0000 mg | ORAL_TABLET | Freq: Every day | ORAL | 1 refills | Status: DC
Start: 2022-08-06 — End: 2023-02-20

## 2022-08-06 MED ORDER — VALSARTAN 320 MG PO TABS
320.0000 mg | ORAL_TABLET | Freq: Every day | ORAL | 1 refills | Status: DC
Start: 2022-08-06 — End: 2023-02-07

## 2022-08-06 NOTE — Progress Notes (Signed)
Subjective:      Patient ID: Jerry Hickman is a 67 y.o. male    Chief Complaint   Patient presents with    Hypertension     6 month follow up- patient does not live in [place] anymore travels for work     Hyperlipidemia     6 month follow up- patient is fasting for blood work         HPI    Jerry Hickman is a 67 y.o. male presenting for annual examination, fasting blood work.    Jerry Hickman is here to follow-up on multiple chronic medical issues.    Patient has recently moved to [place] and will be transferring his care to new doctor at [place].  Until then he would like to get medications refilled.    Hypertension, systolic BP suboptimal range.    -Patient reports blood pressure still runs in between 145-155/70-80s.  -After a lengthy discussion patient agreed to increase the dosage of valsartan from 160 mg to 320 mg from now on.  -Will continue to monitor BP at home and will follow-up in 4 weeks via video visit.  -Denies any associated symptoms.    Elevated LFTs,lab done 07/2021 noted persistent elevated LFTs.  -He had liver ultrasound done 12/2018 which showed hepatic steatosis (fatty liver).-Recommended low-cholesterol diet, low-calorie healthy diet, exercise and advised to avoid excessive alcohol intake;  -Lab today.     Hyperlipidemia, on statin and tolerating well.  -Reviewed low-cholesterol diet and encouraged exercise  -Lab today.    Obesity, BMI more than 37.  -Gradually gaining weight.  Reviewed low-calorie healthy diet and encouraged exercise.  -Lab today.    GERD, well-controlled symptoms on current doses of PPI.  He does take PPI every other day with great benefit.  -Reviewed lifestyle modification in detail.  -Reviewed use and potential side effect of long-term intake of PPI.  Patient verbally understands.     Back pain, low back pain worsening now.  Started few weeks ago.  -No radicular symptoms, no radiation pain to legs, no paresthesia tingling numbness or weakness of legs.  No recent history of  trauma.  -Seen and evaluated by orthopedic specialist and started on oral steroid Medrol Dosepak since yesterday with improvement of symptoms.    Colonoscopy,per patient he underwent colonoscopy 2023 at [facility]; Per patient benign polyps;  -Mother had stomach cancer, sister had appendical cancer but no family history of colon cancer.    Immunization, Tdap 5/ 2021.  Patient received Shingrix vaccine second dose.  Per patient flu vaccine up-to-date.  He had received COVID-vaccine but declined further booster dose.    Past Medical History:   Diagnosis Date    Abnormal vision     reading    Arthritis     L hip    Avascular necrosis     Chronic hip pain     Depression screening negative 12-01-12    Neg    Difficulty in walking(719.7)     limp .Marland KitchenMarland Kitchenpending surgery    GERD (gastroesophageal reflux disease)     on med    H/O splenomegaly     Denies this condition    Headache following lumbar puncture 2004    s/p spinal tap .... blood patch needed    Hyperlipidemia     on med    Hypertensive disorder     on med    Sarcoid     treated with steroids       The  following portions of the patient's history were reviewed and updated as appropriate: current medications, allergies, past family history, past medical history, past social history, past surgical history and problem list.     Review of Systems  Review of Systems   Constitutional: Negative for activity change, appetite change, chills, fatigue, fever and unexpected weight change.   HENT: Negative for congestion, ear pain, hearing loss, postnasal drip, trouble swallowing and voice change.    Eyes: Negative for pain, redness and visual disturbance.   Respiratory: Negative for cough, chest tightness, shortness of breath and wheezing.    Cardiovascular: Negative for chest pain, palpitations and leg swelling.   Gastrointestinal: Negative for abdominal pain, anal bleeding, blood in stool, constipation, diarrhea, nausea and vomiting.   Endocrine: Negative for cold intolerance, heat  intolerance, polydipsia, polyphagia and polyuria.   Genitourinary: Negative for discharge, dysuria, flank pain, frequency, genital sores, genital lesion and urgency.   Musculoskeletal: Positive for low back pain. Negative for arthralgias, back pain, gait problem, joint swelling, myalgias and neck stiffness.   Skin: Negative for pallor, rash and wound.   Allergic/Immunologic: Negative for food allergies.   Neurological: Negative for dizziness, syncope, weakness, light-headedness, numbness and headaches.   Hematological: Negative for adenopathy.   Psychiatric/Behavioral: Negative for behavioral problems, anxiety/nervous, decreased concentration, dysphoric mood, hallucinations, self-injury, sleep disturbance and suicidal ideas.        Patient Active Problem List   Diagnosis    History of sarcoidosis    Essential hypertension, benign    Mixed hyperlipidemia    Other male erectile dysfunction    GERD (gastroesophageal reflux disease)    Localized osteoarthrosis not specified whether primary or secondary, pelvic region and thigh    Umbilical hernia without obstruction or gangrene    Other hydrocele - LEFT    Orchitis and epididymitis    Elevated liver enzymes    Obesity (BMI 30-39.9)     Past Medical History:   Diagnosis Date    Abnormal vision     reading    Arthritis     L hip    Avascular necrosis     Chronic hip pain     Depression screening negative 12-01-12    Neg    Difficulty in walking(719.7)     limp .Marland KitchenMarland Kitchenpending surgery    GERD (gastroesophageal reflux disease)     on med    H/O splenomegaly     Denies this condition    Headache following lumbar puncture 2004    s/p spinal tap .... blood patch needed    Hyperlipidemia     on med    Hypertensive disorder     on med    Sarcoid     treated with steroids     Past Surgical History:   Procedure Laterality Date    ARTHROPLASTY, HIP, TOTAL, ANTERIOR APPROACH, C ARM  03/10/2013    Procedure: ARTHROPLASTY, HIP, TOTAL, ANTERIOR APPROACH, C ARM;  Surgeon: Cynda Familia, MD;   Location: [facility];  Service: Orthopedics;  Laterality: Left;  LEFT HIP THA ANT      COLONOSCOPY, DIAGNOSTIC (SCREENING)  04/08/2012    HYDROCELECTOMY, INGUINAL Left 11/08/2020    Procedure: (LEFT) HYDROCELECTOMY;  Surgeon: Murray Hodgkins, MD;  Location: [facility];  Service: Urology;  Laterality: Left;    JOINT REPLACEMENT      hip and knee    REPLACEMENT TOTAL KNEE  01/18/2011    right    ROTATOR CUFF REPAIR Left  TOTAL HIP ARTHROPLASTY  11/06/2004    Right     Family History   Problem Relation Age of Onset    Cancer Mother         ovarian    Heart attack Father     Heart disease Father     Hyperlipidemia Father     Hypertension Father     Cancer Sister         breast cancer     Social History     Socioeconomic History    Marital status: Single   Tobacco Use    Smoking status: Never     Passive exposure: Never    Smokeless tobacco: Never   Vaping Use    Vaping status: Never Used   Substance and Sexual Activity    Alcohol use: Yes     Alcohol/week: 3.3 standard drinks of alcohol     Types: 3 Glasses of wine per week    Drug use: No   Social History Narrative    The patient is to [place] and has been since 1990, with 2 sons and one girl.  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     Social Determinants of Health     Financial Resource Strain: Low Risk  (08/04/2022)    Overall Financial Resource Strain (CARDIA)     Difficulty of Paying Living Expenses: Not hard at all   Food Insecurity: No Food Insecurity (08/04/2022)    Hunger Vital Sign     Worried About Running Out of Food in the Last Year: Never true     Ran Out of Food in the Last Year: Never true   Transportation Needs: No Transportation Needs (08/04/2022)    PRAPARE - Therapist, art (Medical): No     Lack of Transportation (Non-Medical): No   Physical Activity: Sufficiently Active (08/04/2022)    Exercise Vital Sign     Days of Exercise per Week: 5 days     Minutes of Exercise per  Session: 90 min   Stress: No Stress Concern Present (08/04/2022)    [employer] of Occupational Health - Occupational Stress Questionnaire     Feeling of Stress : Not at all   Social Connections: Unknown (08/04/2022)    Social Connection and Isolation Panel [NHANES]     Frequency of Communication with Friends and Family: More than three times a week     Frequency of Social Gatherings with Friends and Family: More than three times a week     Attends Religious Services: Patient declined     Database administrator or Organizations: Yes     Attends Banker Meetings: More than 4 times per year     Marital Status: Patient declined   Intimate Partner Violence: Not At Risk (08/04/2022)    Humiliation, Afraid, Rape, and Kick questionnaire     Fear of Current or Ex-Partner: No     Emotionally Abused: No     Physically Abused: No     Sexually Abused: No   Housing Stability: Low Risk  (08/04/2022)    Housing Stability Vital Sign     Unable to Pay for Housing in the Last Year: No     Number of Places Lived in the Last Year: 2     Unstable Housing in the Last Year: No     Current Outpatient Medications   Medication Sig Dispense Refill    methylPREDNISolone (MEDROL)  4 MG tablet Take 1 tablet (4 mg) by mouth daily      sildenafil (VIAGRA) 50 MG tablet TAKE 1 TABLET BY MOUTH AS NEEDED FOR ERECTILE DYSFUNCTION 10 tablet 1    atorvastatin (LIPITOR) 20 MG tablet Take 1 tablet (20 mg) by mouth daily 90 tablet 1    pantoprazole (PROTONIX) 20 MG tablet Take 1 tablet (20 mg) by mouth daily 90 tablet 1    valsartan (DIOVAN) 320 MG tablet Take 1 tablet (320 mg) by mouth daily 90 tablet 1     No current facility-administered medications for this visit.     No Known Allergies         Objective:     BP 144/78 (BP Site: Left arm, Patient Position: Sitting, Cuff Size: Large)   Pulse 73   Temp 97.6 F (36.4 C) (Temporal)   Resp 18   Wt 114.8 kg (253 lb)   SpO2 96%   BMI 37.34 kg/m      Physical Exam  Physical Exam    Constitutional: oriented to person, place, and time. Patient appears well-developed and well-nourished. No distress.   HENT:   Head: Normocephalic and atraumatic.   Right Ear: External ear normal.   Left Ear: External ear normal.   Nose: Nose normal.   Mouth/Throat: Oropharynx is clear and moist. No oropharyngeal exudate.   Eyes: Pupils are equal, round, and reactive to light. Conjunctivae and EOM are normal. Right eye exhibits no discharge. Left eye exhibits no discharge. No scleral icterus.   Neck: Normal range of motion. Neck supple. No JVD present. No thyromegaly present.   Cardiovascular: Normal rate, regular rhythm, normal heart sounds and intact distal pulses.  Exam reveals no friction rub.    No murmur heard.  Pulmonary/Chest: Effort normal and breath sounds normal. No stridor. No respiratory distress. No wheezes. No rales.  No tenderness.   Breast: inspection- skin, nipple areola looks normal; Axillary LN normal;  Abdominal: Soft. Bowel sounds are normal. He exhibits no distension. There is no tenderness. There is no rebound and no guarding.   Inguinal LN- normal;  Genitourinary:   Genitourinary Comments: Patient declined GU/DRE examination today.   Musculoskeletal: Normal range of motion. He exhibits no edema, tenderness or deformity.   Lymphadenopathy:     No cervical adenopathy. No axillary lymphadenopathy;  Neurological: alert and oriented to person, place, and time. Normal reflexes. Normal reflexes. No cranial nerve deficit. Normal muscle tone. Coordination normal.   Skin: Skin is warm. No rash noted. Not diaphoretic. No erythema. No pallor.   Scattered benign looking nevi, freckles.  Limited skin checkup as patient did not take off pants.   Psych: Mood/affect normal; Behavior normal. Judgement and thought content normal;       Assessment/Plan:     67 y.o. male presents for physical and fasting blood work      Discussed the patient's BMI with him.  Body mass index is 37.34 kg/m.   HIgh BMI Follow  Up  Encouragement to Exercise  Nutrition and Physical Activity Counseling:  Nutrition Counseling:  Food education, guidance and counseling  Physical Activity Counseling  Strength training exercise promotion    Counseling: Counseling/Anticipatory Guidance (40-64): Nutrition, physical activity, healthy weight, injury prevention, misuse of tobacco, alcohol and drugs, sexual behavior and STDs, contraception, dental health, mental health, immunizations, screenings.       Health maintenance:  -Fasting blood work: today, reviewed planned labs and pt agrees to them.   -Immunizations: Tdap 5/ 2021.  Patient received Shingrix vaccine second dose.  Per patient flu vaccine up-to-date.  He had received COVID-vaccine but declined further booster dose.  -STD screen: Patient did not request STD screening.  Denies any high risk sexual behavior.  Denies any associated symptoms.  -Colonoscopy: Per patient underwent colonoscopy 2023 at [facility], per patient he was noted to have benign polyp and was told to repeat colonoscopy in 3 years.  Denies any GI related symptoms.  -Prostate: Denies any associated symptom of BPH.  PSA lab today.  -Vision: Within 1-2 year.  Uses corrective lens.  -Dental: Within 1-2 year    Patient has recently moved to [place] and will be transferring his care to new doctor at [place].  Until then he would like to get medications refilled.    Hypertension, systolic BP suboptimal range. Denies any associated symptoms.   -Patient reports blood pressure still runs in between 145-155/70-80s.  -After a lengthy discussion patient agreed to increase the dosage of valsartan from 160 mg to 320 mg from now on.  -Will continue to monitor BP at home and will follow-up in 4 weeks via video visit.  -Advised to continue monitoring BP at home.  If persistently elevated blood pressure more than 150/90 is to call back.  Patient agreeable with the plan.  -Reviewed low-salt diet and exercise daily basis.  -Reviewed signs symptom of  elevated blood pressure in detail that necessitate urgent/ED evaluation.  Patient verbally understands  -Lab today.      Elevated LFTs,lab done 07/2021 noted persistent elevated LFTs.  -He had liver ultrasound done 12/2018 which showed hepatic steatosis (fatty liver).  -Recommended low-cholesterol diet, low-calorie healthy diet, exercise and advised to avoid excessive alcohol intake;  -Lab today.     Hyperlipidemia, on statin and tolerating well.  -Reviewed low-cholesterol diet and encouraged exercise  -Lab today.    Obesity, BMI more than 37.  -Gradually gaining weight.  Reviewed low-calorie healthy diet and encouraged exercise.  -Lab today.      GERD, well-controlled symptoms on current doses of PPI.  He does take PPI every other day with great benefit.  -Reviewed lifestyle modification in detail.  -Reviewed use and potential side effect of long-term intake of PPI.  Patient verbally understands.  -Per patient he underwent EGD last year 2023, per patient EGD was unremarkable.     Back pain, low back pain worsening now.  Started few weeks ago.  -No radicular symptoms, no radiation pain to legs, no paresthesia tingling numbness or weakness of legs.  No recent history of trauma.  -Seen and evaluated by orthopedic specialist and started on oral steroid Medrol Dosepak since yesterday with improvement of symptoms.    Call if you develop any new or worsening symptoms  Proceed to urgent care or ED after hours if necessary    Malene Blaydes, MD        Return in about 4 weeks (around 09/03/2022).

## 2022-08-06 NOTE — Progress Notes (Signed)
Have you seen any specialists since your last visit with us?  No      The patient was informed that the following HM items are still outstanding:   colonoscopy and shingles vaccine

## 2022-08-07 ENCOUNTER — Other Ambulatory Visit (INDEPENDENT_AMBULATORY_CARE_PROVIDER_SITE_OTHER): Payer: Self-pay | Admitting: Family Medicine

## 2022-08-07 ENCOUNTER — Ambulatory Visit (INDEPENDENT_AMBULATORY_CARE_PROVIDER_SITE_OTHER): Payer: No Typology Code available for payment source | Admitting: Family Medicine

## 2022-08-07 DIAGNOSIS — R748 Abnormal levels of other serum enzymes: Secondary | ICD-10-CM

## 2022-08-07 NOTE — Progress Notes (Signed)
Please inform patient following    -Liver function test elevated and got worse from previous blood work from last year.  Recommending repeat liver ultrasound as soon as possible.  I will place liver ultrasound order.  Recommended to repeat liver function test in 4 to 6 weeks.  -Recommending weight loss with low-cholesterol diet and exercise.  Recommending avoid excessive alcohol intake.  -Cholesterol level elevated borderline and not optimal range.  Still can continue atorvastatin, will continue to monitor liver function test closely.  -Sugar at prediabetic range.  Recommending low carbohydrate diet and exercise regular basis.  -Blood count unremarkable.  White blood cell count, hemoglobin, platelet count within normal range.  No anemia detected.  -Urine analysis unremarkable.  No infection noted.  --Prostate lab, thyroid lab within normal range.  -Urine protein within normal range.

## 2022-08-14 ENCOUNTER — Encounter (INDEPENDENT_AMBULATORY_CARE_PROVIDER_SITE_OTHER): Payer: Self-pay

## 2022-08-24 ENCOUNTER — Other Ambulatory Visit (INDEPENDENT_AMBULATORY_CARE_PROVIDER_SITE_OTHER): Payer: Self-pay | Admitting: Family Medicine

## 2022-09-06 ENCOUNTER — Telehealth (INDEPENDENT_AMBULATORY_CARE_PROVIDER_SITE_OTHER): Payer: No Typology Code available for payment source | Admitting: Family Medicine

## 2022-10-20 ENCOUNTER — Other Ambulatory Visit (INDEPENDENT_AMBULATORY_CARE_PROVIDER_SITE_OTHER): Payer: Self-pay | Admitting: Family Medicine

## 2022-10-20 DIAGNOSIS — E782 Mixed hyperlipidemia: Secondary | ICD-10-CM

## 2022-10-21 NOTE — Telephone Encounter (Signed)
Patient has refill available.

## 2023-02-04 ENCOUNTER — Other Ambulatory Visit (INDEPENDENT_AMBULATORY_CARE_PROVIDER_SITE_OTHER): Payer: Self-pay | Admitting: Family Medicine

## 2023-02-04 DIAGNOSIS — K219 Gastro-esophageal reflux disease without esophagitis: Secondary | ICD-10-CM

## 2023-02-05 NOTE — Telephone Encounter (Signed)
LOV 08/06/22  Per note, patient moved to [facility]

## 2023-02-14 ENCOUNTER — Encounter (INDEPENDENT_AMBULATORY_CARE_PROVIDER_SITE_OTHER): Payer: Self-pay

## 2023-02-20 ENCOUNTER — Other Ambulatory Visit (INDEPENDENT_AMBULATORY_CARE_PROVIDER_SITE_OTHER): Payer: Self-pay | Admitting: Family Medicine

## 2023-02-20 DIAGNOSIS — E782 Mixed hyperlipidemia: Secondary | ICD-10-CM

## 2023-03-09 IMAGING — MR MRI RIGHT FOOT WITHOUT CONTRAST
4 of 6 series · 13 of 40 positions shown · IV contrast (gadolinium)
Comparison: None.

________________________________________________________________________________________________ 
MRI RIGHT FOOT WITHOUT CONTRAST, 03/09/2023 [DATE]: 
CLINICAL INDICATION: Pain In Right Foot
TECHNIQUE: Multiplanar, multiecho position MR images of the right foot with 
attention to the forefoot and midfoot were performed without intravenous 
gadolinium enhancement. Patient was scanned on a 3T magnet

[Series 201: survey_mst · axial · 10.0mm · 0.78mm/px · z∈[-92,+173]mm · 2 of 15 slices shown]
[im 1/15]
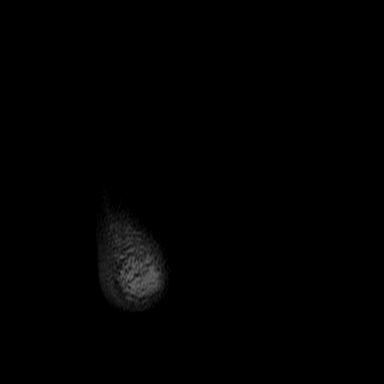
[im 15/15]
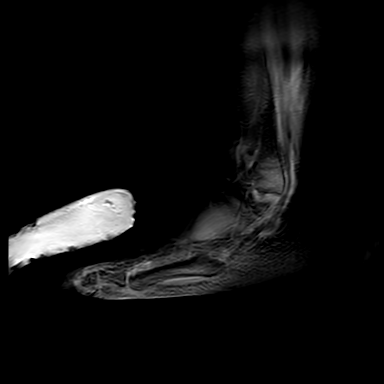

[Series 301: t1w_foot cs hr · sagittal · 2.5mm · 0.29mm/px · 3 of 37 slices shown]
[im 8/37]
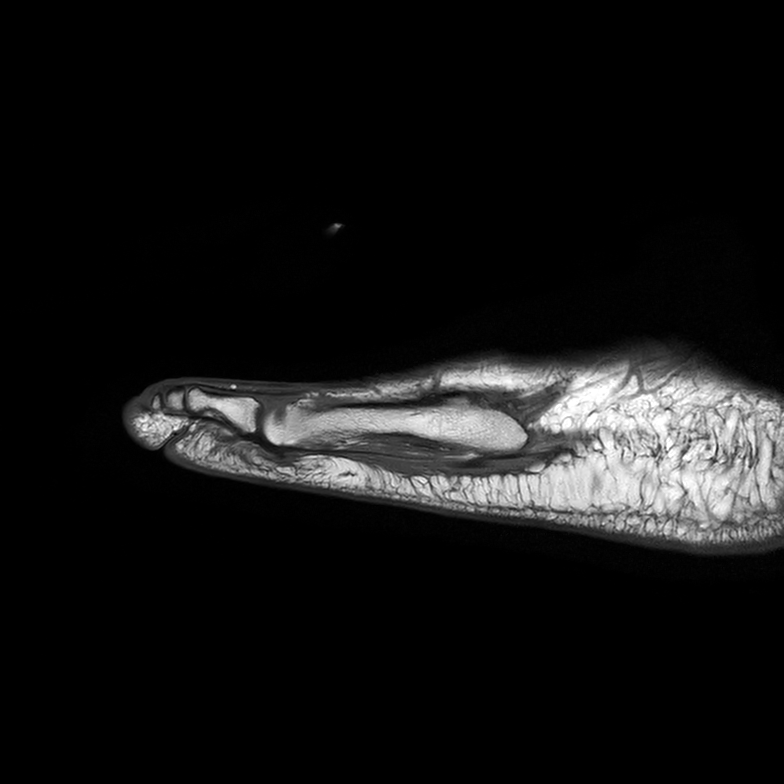
[im 22/37]
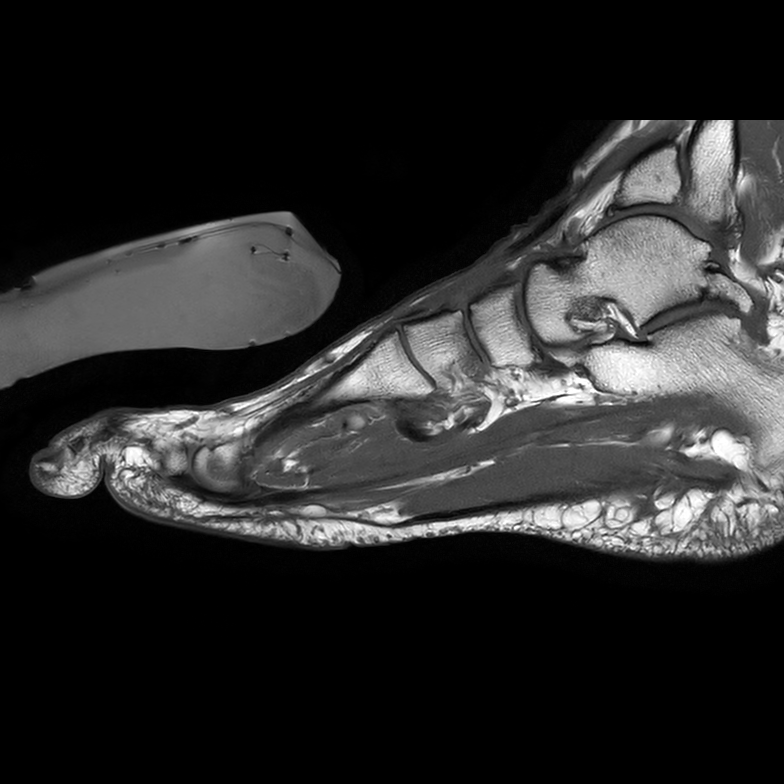
[im 37/37]
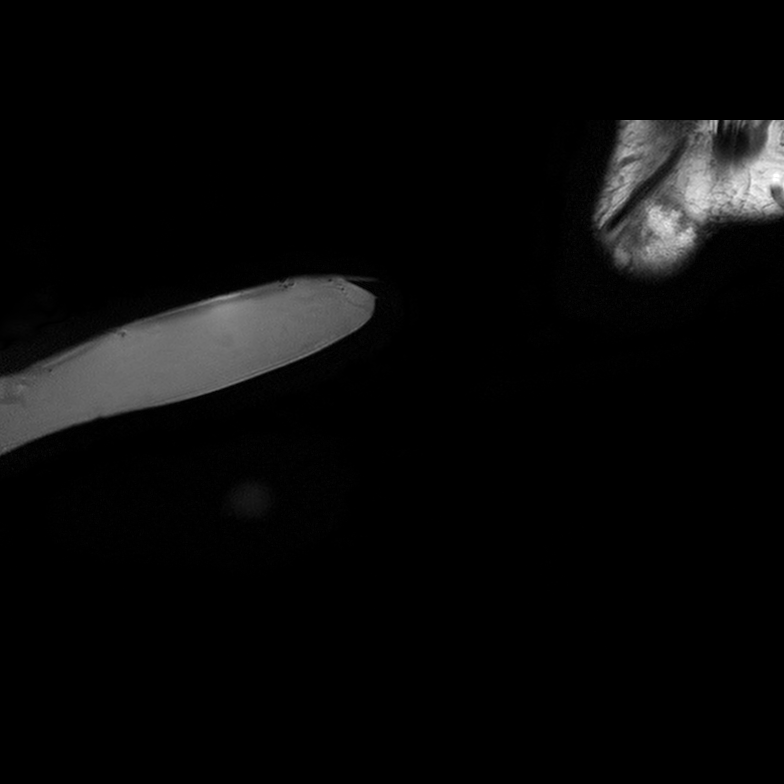

[Series 401: PD fat-sat · sagittal · 2.5mm · 0.32mm/px · 5 of 36 slices shown]
[im 1/36]
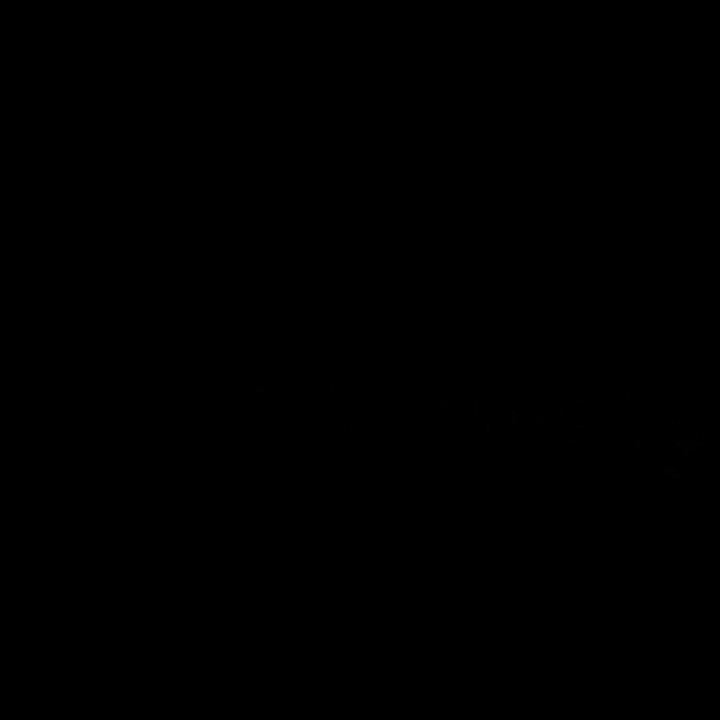
[im 8/36]
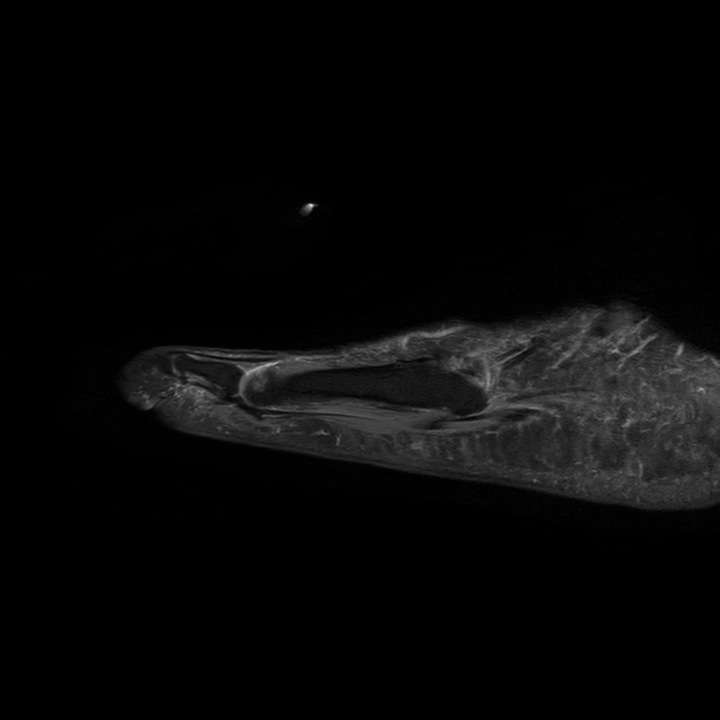
[im 15/36]
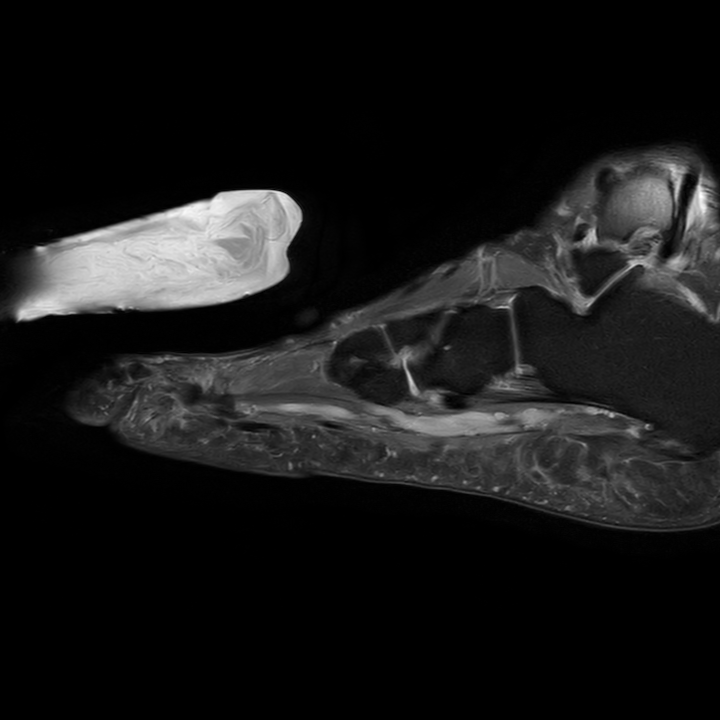
[im 22/36]
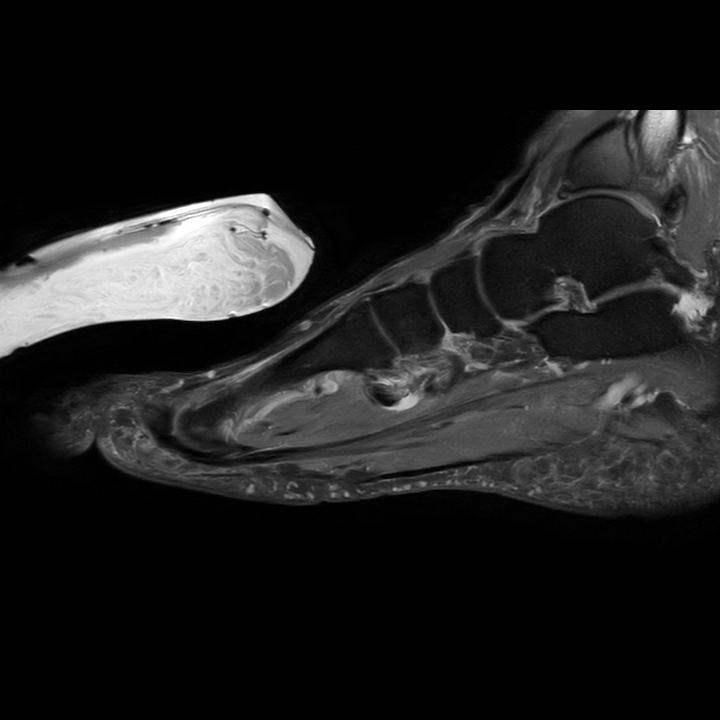
[im 36/36]
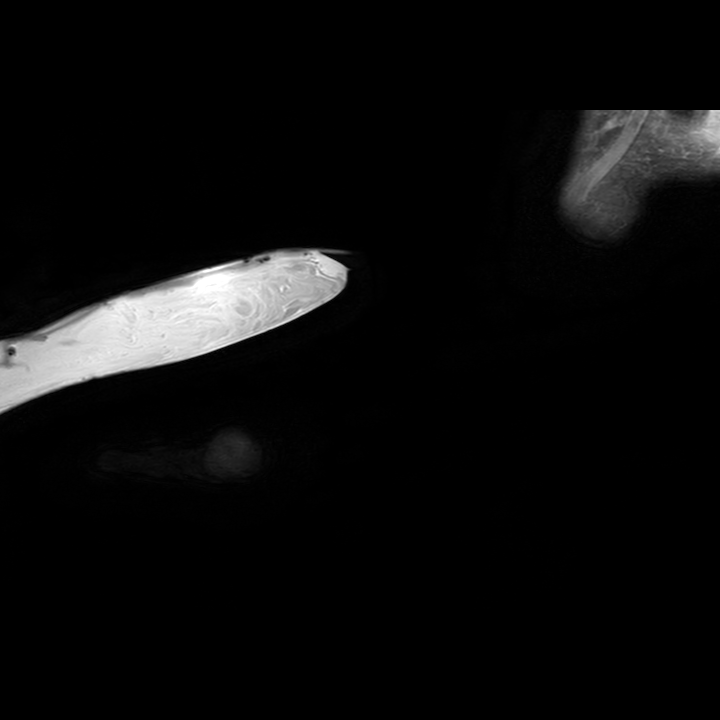

[Series 601: T1 · coronal · 3.0mm · 0.27mm/px · 3 of 56 slices shown]
[im 7/56]
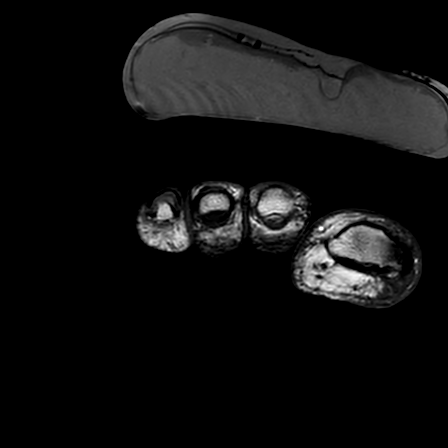
[im 31/56]
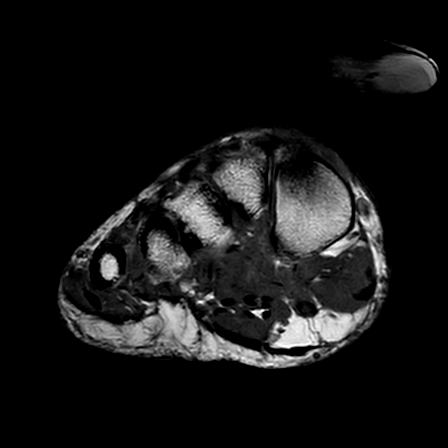
[im 49/56]
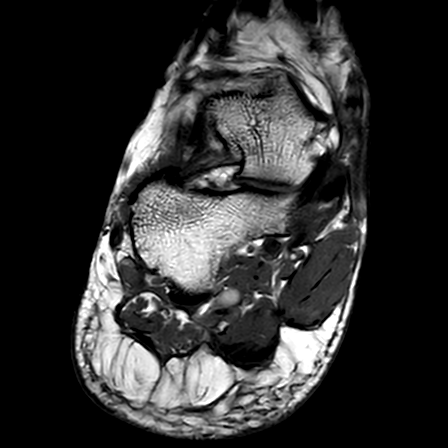

[13 of 40 positions shown; findings below may reference images not displayed]

FINDINGS: BONES/JOINTS: Normal bone marrow signal intensity. No osteomyelitis, fracture, 
contusion or stress response. Mild degenerative change of the hallux 
metatarsal-phalangeal (MTP) joint. Degenerative change of the bipartite tibial 
hallux sesamoid and full-thickness cartilage loss between the sesamoid and 
hallux metatarsal head. Small fifth metatarsal head subcortical cysts. The 
remaining MTP and interphalangeal (IP) joints are preserved. No plantar plate 
tear or capsulitis. Mild degenerative change of the midfoot with multifocal 
partial thickness chondromalacia and small subcortical cysts. Mild degenerative 
change of the posterior facet of the subtalar joint. Mild degenerative change of 
the ankle with partial-thickness chondromalacia and subcortical cysts. Mildly 
prominent superior calcaneal vascular remnant.  
MUSCLES: Musculature is symmetric without mass, signal abnormality or atrophy.  
TENDONS: The imaged portions of the flexor, extensor and peroneal tendons are 
intact. No tendon tear, tenosynovitis or tendinosis. No tendon subluxation.  
SUBCUTANEOUS/OTHER SOFT TISSUES: Dorsal skin marker overlying the fourth mid 
metatarsal shaft indicates site of symptoms. No underlying lesion. No 
ulceration. No abscess. No interdigital neuroma or bursitis. Plantar fascia is 
intact.
IMPRESSION: 1.  No fourth metatarsal abnormality corresponding to marked region of interest. 
2.  Multifocal degenerative change.

## 2023-03-16 ENCOUNTER — Encounter (INDEPENDENT_AMBULATORY_CARE_PROVIDER_SITE_OTHER): Payer: Self-pay

## 2023-04-16 ENCOUNTER — Encounter (INDEPENDENT_AMBULATORY_CARE_PROVIDER_SITE_OTHER): Payer: Self-pay

## 2023-05-03 ENCOUNTER — Other Ambulatory Visit (INDEPENDENT_AMBULATORY_CARE_PROVIDER_SITE_OTHER): Payer: Self-pay | Admitting: Family Medicine

## 2023-05-03 DIAGNOSIS — E782 Mixed hyperlipidemia: Secondary | ICD-10-CM

## 2023-05-04 ENCOUNTER — Telehealth (INDEPENDENT_AMBULATORY_CARE_PROVIDER_SITE_OTHER): Payer: Self-pay | Admitting: Family Medicine

## 2023-05-04 NOTE — Telephone Encounter (Signed)
 Patient need an appt to continue refill; Last OV 07/2022;

## 2023-06-03 ENCOUNTER — Encounter (INDEPENDENT_AMBULATORY_CARE_PROVIDER_SITE_OTHER): Payer: Self-pay

## 2023-08-05 ENCOUNTER — Other Ambulatory Visit (INDEPENDENT_AMBULATORY_CARE_PROVIDER_SITE_OTHER): Payer: Self-pay | Admitting: Family Medicine

## 2023-08-05 DIAGNOSIS — K219 Gastro-esophageal reflux disease without esophagitis: Secondary | ICD-10-CM

## 2023-08-06 ENCOUNTER — Telehealth (INDEPENDENT_AMBULATORY_CARE_PROVIDER_SITE_OTHER): Payer: Self-pay | Admitting: Family Medicine

## 2023-08-06 NOTE — Telephone Encounter (Signed)
 Contacted pt. He has moved to [facility] and got an appointment in 2 weeks with a provider down there.

## 2023-08-06 NOTE — Telephone Encounter (Signed)
 Patient need an appointment to continue refill.  Last office visit was 07/2022.  I will refill only for 30 days.
